# Patient Record
Sex: Female | Born: 1960
Health system: Southern US, Community
[De-identification: ages and names within clinical notes are randomized; demographics above are authoritative.]

## PROBLEM LIST (undated history)

## (undated) DIAGNOSIS — I700 Atherosclerosis of aorta: Secondary | ICD-10-CM

## (undated) DIAGNOSIS — E119 Type 2 diabetes mellitus without complications: Secondary | ICD-10-CM

## (undated) DIAGNOSIS — M549 Dorsalgia, unspecified: Secondary | ICD-10-CM

## (undated) HISTORY — PX: KNEE SURGERY: SHX244

## (undated) HISTORY — DX: Atherosclerosis of aorta: I70.0

## (undated) HISTORY — PX: ROTATOR CUFF REPAIR: SHX139

## (undated) HISTORY — DX: Type 2 diabetes mellitus without complications: E11.9

---

## 2003-02-02 ENCOUNTER — Emergency Department (HOSPITAL_COMMUNITY): Admission: EM | Admit: 2003-02-02 | Discharge: 2003-02-02 | Payer: Self-pay | Admitting: Emergency Medicine

## 2007-04-24 ENCOUNTER — Emergency Department (HOSPITAL_COMMUNITY): Admission: EM | Admit: 2007-04-24 | Discharge: 2007-04-24 | Payer: Self-pay | Admitting: Family Medicine

## 2011-09-13 ENCOUNTER — Emergency Department (HOSPITAL_COMMUNITY)
Admission: EM | Admit: 2011-09-13 | Discharge: 2011-09-14 | Disposition: A | Discharge status: Home / Self Care | Payer: Worker's Compensation | Attending: Emergency Medicine | Admitting: Emergency Medicine

## 2011-09-13 ENCOUNTER — Encounter (HOSPITAL_COMMUNITY): Payer: Self-pay

## 2011-09-13 DIAGNOSIS — Z7982 Long term (current) use of aspirin: Secondary | ICD-10-CM | POA: Insufficient documentation

## 2011-09-13 DIAGNOSIS — Y9229 Other specified public building as the place of occurrence of the external cause: Secondary | ICD-10-CM | POA: Insufficient documentation

## 2011-09-13 DIAGNOSIS — W19XXXA Unspecified fall, initial encounter: Secondary | ICD-10-CM

## 2011-09-13 DIAGNOSIS — F17200 Nicotine dependence, unspecified, uncomplicated: Secondary | ICD-10-CM | POA: Insufficient documentation

## 2011-09-13 DIAGNOSIS — M542 Cervicalgia: Secondary | ICD-10-CM | POA: Insufficient documentation

## 2011-09-13 DIAGNOSIS — R42 Dizziness and giddiness: Secondary | ICD-10-CM | POA: Insufficient documentation

## 2011-09-13 DIAGNOSIS — M549 Dorsalgia, unspecified: Secondary | ICD-10-CM | POA: Insufficient documentation

## 2011-09-13 DIAGNOSIS — W010XXA Fall on same level from slipping, tripping and stumbling without subsequent striking against object, initial encounter: Secondary | ICD-10-CM | POA: Insufficient documentation

## 2011-09-13 DIAGNOSIS — R112 Nausea with vomiting, unspecified: Secondary | ICD-10-CM | POA: Insufficient documentation

## 2011-09-13 NOTE — ED Notes (Signed)
Bed:WHALA<BR> Expected date:<BR> Expected time:<BR> Means of arrival:<BR> Comments:<BR> EMS-Fall

## 2011-09-13 NOTE — ED Notes (Signed)
PER EMS- pt picked up from work [employer] account [employer]) with c/o slipping and fell straight backward, reports hitting neck and back. 2 coworkers helped pt into a wheelchair.  Upon ems, pt started to c/o nausea and dizziness.  No LOC, denies head injury, no vomiting.  Pt alert and oriented.  Placed on KED and c-collar prior to arrival to ED.  Pt remembers the fall.  Pt c/o 9/10 lower back pain.  Pt is able to move all extremities, denies numbness or tingling.

## 2011-09-14 ENCOUNTER — Emergency Department (HOSPITAL_COMMUNITY): Payer: Worker's Compensation

## 2011-09-14 MED ORDER — HYDROCODONE-ACETAMINOPHEN 5-500 MG PO TABS: 1.0000 | ORAL_TABLET | Freq: Four times a day (QID) | ORAL | Status: AC | PRN

## 2011-09-14 MED ORDER — CYCLOBENZAPRINE HCL 10 MG PO TABS: 5.0000 mg | ORAL_TABLET | Freq: Two times a day (BID) | ORAL | Status: AC | PRN

## 2011-09-14 MED ORDER — OXYCODONE-ACETAMINOPHEN 5-325 MG PO TABS
2.0000 | ORAL_TABLET | Freq: Once | ORAL | Status: AC
  Administered 2011-09-14: 2 via ORAL
  Filled 2011-09-14: qty 2

## 2011-09-14 NOTE — ED Notes (Signed)
Xray called to inform that pt could not go to xray with KED in place. Informed Tiffany, PA. KED removed and xray informed.

## 2011-09-14 NOTE — ED Provider Notes (Signed)
History     CSN: [id number]  Arrival date & time 09/13/11  2224   First MD Initiated Contact with Patient 09/14/11 0116      Chief Complaint  Patient presents with  . Fall    (Consider location/radiation/quality/duration/timing/severity/associated sxs/prior treatment) HPI  Patient to the ER after a fall at [facility]. She says that she tripped on a palate falling backwards hitting her head and neck. Two co-workers helped her back up and got her into a wheelchair, since then EMS has put her in a c-collar and sitting spine board. She says that along with neck pain and back pain she is having nausea, vomiting, dizziness. No LOC or vomiting. She denies weakness, numbness or tingling. She appears uncomfortable but is in no acute distress.  History reviewed. No pertinent past medical history.  Past Surgical History  Procedure Date  . Knee surgery     No family history on file.  History  Substance Use Topics  . Smoking status: Current Some Day Smoker    Types: Cigarettes  . Smokeless tobacco: Not on file  . Alcohol Use: Yes    OB History    Grav Para Term Preterm Abortions TAB SAB Ect Mult Living                  Review of Systems   HEENT: denies blurry vision or change in hearing PULMONARY: Denies difficulty breathing and SOB CARDIAC: denies chest pain or heart palpitations MUSCULOSKELETAL:  denies being unable to ambulate ABDOMEN AL: denies abdominal pain GU: denies loss of bowel or urinary control NEURO: denies numbness and tingling in extremities SKIN: no new rashes PSYCH: patient denies anxiety or depression. NECK: Pt denies having neck pain     Allergies  Review of patient's allergies indicates no known allergies.  Home Medications   Current Outpatient Rx  Name Route Sig Dispense Refill  . ASPIRIN EC 81 MG PO TBEC Oral Take 81 mg by mouth every other day.    . CYCLOBENZAPRINE HCL 10 MG PO TABS Oral Take 0.5 tablets (5 mg total) by mouth 2 (two) times  daily as needed for muscle spasms. 20 tablet 0  . HYDROCODONE-ACETAMINOPHEN 5-500 MG PO TABS Oral Take 1-2 tablets by mouth every 6 (six) hours as needed for pain. 15 tablet 0    BP 139/74  Pulse 60  Temp 98.4 F (36.9 C) (Oral)  Resp 16  SpO2 100%  LMP 09/11/2011  Physical Exam  Nursing note and vitals reviewed. Constitutional: She appears well-developed and well-nourished. No distress.  HENT:  Head: Normocephalic. Head is with contusion. Head is without raccoon's eyes, without Battle's sign, without abrasion, without laceration, without right periorbital erythema and without left periorbital erythema. Hair is normal.    Eyes: Pupils are equal, round, and reactive to light.  Neck: Normal range of motion. Neck supple.  Cardiovascular: Normal rate and regular rhythm.   Pulmonary/Chest: Effort normal.  Abdominal: Soft.  Musculoskeletal:       Cervical back: She exhibits decreased range of motion, tenderness, pain and spasm. She exhibits no bony tenderness, no swelling, no edema, no deformity, no laceration and normal pulse.       Back:        Equal strength to bilateral lower extremities. Neurosensory function adequate to both legs. Skin color is normal. Skin is warm and moist. I see no step off deformity, no bony tenderness. Pt is able to ambulate without limp. Pain is relieved when sitting in certain positions.  ROM is decreased due to pain. No crepitus, laceration, effusion, swelling.  Pulses are normal   Neurological: She is alert.  Skin: Skin is warm and dry.    ED Course  Procedures (including critical care time)  Labs Reviewed - No data to display No results found.   1. Fall       MDM  Patients work-up is negative for any abnormalities.  Patient with back pain. No neurological deficits. Patient is ambulatory. No warning symptoms of back pain including: loss of bowel or bladder control, night sweats, waking from sleep with back pain, unexplained fevers or weight  loss, h/o cancer, IVDU, recent trauma. No concern for cauda equina, epidural abscess, or other serious cause of back pain. Conservative measures such as rest, ice/heat and pain medicine indicated with PCP follow-up if no improvement with conservative management.   The patient does not need further testing at this time. I have prescribed Pain medication and Flexeril for the patient. As well as given the patient a referral for Ortho. The patient is stable and this time and has no other concerns of questions.  The patient has been informed to return to the ED if a change or worsening in symptoms occur.            Dorthula Matas, PA 09/18/11 [phone]

## 2011-09-22 NOTE — ED Provider Notes (Signed)
Medical screening examination/treatment/procedure(s) were performed by non-physician practitioner and as supervising physician I was immediately available for consultation/collaboration.  Derwood Kaplan, MD 09/22/11 [phone]

## 2011-12-27 ENCOUNTER — Emergency Department (HOSPITAL_COMMUNITY)
Admission: EM | Admit: 2011-12-27 | Discharge: 2011-12-27 | Disposition: A | Discharge status: Home / Self Care | Payer: Worker's Compensation | Attending: Emergency Medicine | Admitting: Emergency Medicine

## 2011-12-27 ENCOUNTER — Encounter (HOSPITAL_COMMUNITY): Payer: Self-pay | Admitting: *Deleted

## 2011-12-27 DIAGNOSIS — M545 Low back pain, unspecified: Secondary | ICD-10-CM | POA: Insufficient documentation

## 2011-12-27 DIAGNOSIS — F17200 Nicotine dependence, unspecified, uncomplicated: Secondary | ICD-10-CM | POA: Insufficient documentation

## 2011-12-27 MED ORDER — ONDANSETRON 8 MG PO TBDP
8.0000 mg | ORAL_TABLET | Freq: Once | ORAL | Status: AC
  Administered 2011-12-27: 8 mg via ORAL
  Filled 2011-12-27: qty 1

## 2011-12-27 MED ORDER — NAPROXEN 500 MG PO TABS: 500.0000 mg | ORAL_TABLET | Freq: Two times a day (BID) | ORAL | Status: DC

## 2011-12-27 MED ORDER — HYDROMORPHONE HCL PF 1 MG/ML IJ SOLN
1.0000 mg | Freq: Once | INTRAMUSCULAR | Status: AC
  Administered 2011-12-27: 1 mg via INTRAMUSCULAR
  Filled 2011-12-27: qty 1

## 2011-12-27 MED ORDER — HYDROCODONE-ACETAMINOPHEN 5-325 MG PO TABS: ORAL_TABLET | ORAL | Status: DC

## 2011-12-27 MED ORDER — METHOCARBAMOL 500 MG PO TABS: ORAL_TABLET | ORAL | Status: DC

## 2011-12-27 NOTE — ED Notes (Signed)
Fell at work in August - being treated by orthopedic.  States back pain and spasms became worse today.  Denies new injury.

## 2011-12-27 NOTE — ED Provider Notes (Signed)
History     CSN: [id number]  Arrival date & time 12/27/11  1528   First MD Initiated Contact with Patient 12/27/11 1541      Chief Complaint  Patient presents with  . Back Pain    (Consider location/radiation/quality/duration/timing/severity/associated sxs/prior treatment) HPI Comments: Patient with chronic low back pain secondary to a work related injury c/o increasing pain and muscle spasms in her back since last evening.  She states that she is continuing to work and states the job she does causes the pain to become worse at times.  She states she is currently being treated by [facility].  She denies new injury, numbness or weakness of the extremities, saddle anesthesia's, dysuria or incontinence.    Patient is a 51 y.o. female presenting with back pain. The history is provided by the patient.  Back Pain  This is a chronic problem. The current episode started more than 1 week ago. The problem occurs constantly. The problem has been gradually worsening. The pain is associated with lifting heavy objects and twisting. The pain is present in the lumbar spine and sacro-iliac joint. The pain radiates to the left thigh, right thigh, left knee, left foot, right knee and right foot. The pain is moderate. The symptoms are aggravated by bending, twisting and certain positions. The pain is worse during the day. Associated symptoms include leg pain. Pertinent negatives include no chest pain, no fever, no numbness, no abdominal pain, no abdominal swelling, no bowel incontinence, no perianal numbness, no bladder incontinence, no dysuria, no pelvic pain, no paresthesias, no paresis, no tingling and no weakness. Associated symptoms comments: Muscle spasms. She has tried NSAIDs and muscle relaxants for the symptoms. The treatment provided mild relief.    History reviewed. No pertinent past medical history.  Past Surgical History  Procedure Date  . Knee surgery     No family history on  file.  History  Substance Use Topics  . Smoking status: Current Some Day Smoker    Types: Cigarettes  . Smokeless tobacco: Not on file  . Alcohol Use: No    OB History    Grav Para Term Preterm Abortions TAB SAB Ect Mult Living                  Review of Systems  Constitutional: Negative for fever.  Respiratory: Negative for shortness of breath.   Cardiovascular: Negative for chest pain.  Gastrointestinal: Negative for vomiting, abdominal pain, constipation and bowel incontinence.  Genitourinary: Negative for bladder incontinence, dysuria, hematuria, flank pain, decreased urine volume, difficulty urinating and pelvic pain.       No perineal numbness or incontinence of urine or feces  Musculoskeletal: Positive for back pain. Negative for joint swelling.  Skin: Negative for rash.  Neurological: Negative for tingling, weakness, numbness and paresthesias.  All other systems reviewed and are negative.    Allergies  Review of patient's allergies indicates no known allergies.  Home Medications   Current Outpatient Rx  Name  Route  Sig  Dispense  Refill  . DIPHENHYDRAMINE-APAP (SLEEP) 25-500 MG PO TABS   Oral   Take 1 tablet by mouth at bedtime as needed. Sleep           BP 126/56  Pulse 84  Temp 97.9 F (36.6 C) (Oral)  Resp 20  Ht 5\' 8"  (1.727 m)  Wt 195 lb (88.451 kg)  BMI 29.65 kg/m2  SpO2 96%  LMP 09/11/2011  Physical Exam  Nursing note and vitals reviewed.  Constitutional: She is oriented to person, place, and time. She appears well-developed and well-nourished. No distress.  HENT:  Head: Normocephalic and atraumatic.  Neck: Normal range of motion. Neck supple.  Cardiovascular: Normal rate, regular rhythm and intact distal pulses.   No murmur heard. Pulmonary/Chest: Effort normal and breath sounds normal.  Musculoskeletal: She exhibits tenderness. She exhibits no edema.       Lumbar back: She exhibits tenderness, bony tenderness and pain. She exhibits  normal range of motion, no swelling, no deformity, no laceration and normal pulse.       Back:       Diffuse ttp of the lumbar spine and paraspinal muscles.  DP pulses are brisk and equal bilaterally, distal sensation intact,  No distal edema or discoloration  Neurological: She is alert and oriented to person, place, and time. No cranial nerve deficit or sensory deficit. She exhibits normal muscle tone. Coordination and gait normal.  Reflex Scores:      Patellar reflexes are 2+ on the right side and 2+ on the left side.      Achilles reflexes are 2+ on the right side and 2+ on the left side. Skin: Skin is warm and dry.    ED Course  Procedures (including critical care time)  Labs Reviewed - No data to display No results found.      MDM    Patient has diffuse ttp of the lumbar paraspinal muscles.  No focal neuro deficits on exam.  Ambulates with a slow but steady gait.     Patient is currently under orthopedic care at [facility].  Low back pain worse after excessive walking and standing at work last evening.  Pt agrees to follow-up with her orthopedic physician   IM Dilaudid given in the ED  Prescribed: norco #20 Robaxin  naprosyn  Melea Prezioso L. Trisha Mangle, [place] 12/27/11 2011

## 2011-12-29 NOTE — ED Provider Notes (Signed)
Medical screening examination/treatment/procedure(s) were performed by non-physician practitioner and as supervising physician I was immediately available for consultation/collaboration.   Joya Gaskins, MD 12/29/11 [phone]

## 2012-02-19 ENCOUNTER — Encounter (HOSPITAL_COMMUNITY): Payer: Self-pay | Admitting: *Deleted

## 2012-02-19 ENCOUNTER — Emergency Department (HOSPITAL_COMMUNITY)
Admission: EM | Admit: 2012-02-19 | Discharge: 2012-02-19 | Disposition: A | Discharge status: Home / Self Care | Payer: Worker's Compensation | Attending: Emergency Medicine | Admitting: Emergency Medicine

## 2012-02-19 DIAGNOSIS — M5416 Radiculopathy, lumbar region: Secondary | ICD-10-CM

## 2012-02-19 DIAGNOSIS — Z79899 Other long term (current) drug therapy: Secondary | ICD-10-CM | POA: Insufficient documentation

## 2012-02-19 DIAGNOSIS — F17200 Nicotine dependence, unspecified, uncomplicated: Secondary | ICD-10-CM | POA: Insufficient documentation

## 2012-02-19 DIAGNOSIS — IMO0002 Reserved for concepts with insufficient information to code with codable children: Secondary | ICD-10-CM | POA: Insufficient documentation

## 2012-02-19 HISTORY — DX: Dorsalgia, unspecified: M54.9

## 2012-02-19 LAB — BASIC METABOLIC PANEL
CO2: 26 mEq/L (ref 19–32)
Calcium: 9.7 mg/dL (ref 8.4–10.5)
Chloride: 99 mEq/L (ref 96–112)
Creatinine, Ser: 0.78 mg/dL (ref 0.50–1.10)
GFR calc Af Amer: >90 mL/min (ref >90)
GFR calc non Af Amer: >90 mL/min (ref >90)
Potassium: 4 mEq/L (ref 3.5–5.1)
Sodium: 136 mEq/L (ref 135–145)

## 2012-02-19 MED ORDER — HYDROMORPHONE HCL PF 1 MG/ML IJ SOLN
1.0000 mg | Freq: Once | INTRAMUSCULAR | Status: AC
  Administered 2012-02-19: 1 mg via INTRAMUSCULAR
  Filled 2012-02-19: qty 1

## 2012-02-19 MED ORDER — ONDANSETRON 8 MG PO TBDP
ORAL_TABLET | ORAL | Status: AC
  Administered 2012-02-19: 8 mg
  Filled 2012-02-19: qty 1

## 2012-02-19 MED ORDER — OXYCODONE-ACETAMINOPHEN 5-325 MG PO TABS: 1.0000 | ORAL_TABLET | ORAL | Status: AC | PRN

## 2012-02-19 MED ORDER — METHOCARBAMOL 500 MG PO TABS: 1000.0000 mg | ORAL_TABLET | Freq: Once | ORAL | Status: DC

## 2012-02-19 MED ORDER — DIAZEPAM 5 MG PO TABS: 5.0000 mg | ORAL_TABLET | Freq: Three times a day (TID) | ORAL | Status: DC | PRN

## 2012-02-19 NOTE — ED Notes (Signed)
Back spasms, worsening today with hx of same.  Denies new injury.

## 2012-02-19 NOTE — ED Notes (Signed)
Pain and "cramping" in back , legs and feet. Fell in Aug. 2013, and has had problem since then.  Plans to have injections in back.  No new injury.

## 2012-02-20 NOTE — ED Provider Notes (Signed)
Medical screening examination/treatment/procedure(s) were performed by non-physician practitioner and as supervising physician I was immediately available for consultation/collaboration. Mercy Leppla, MD, FACEP   Kayton Dunaj L Carmaleta Youngers, MD 02/20/12 1100 

## 2012-02-20 NOTE — ED Provider Notes (Signed)
History     CSN: [id number]  Arrival date & time 02/19/12  1120   First MD Initiated Contact with Patient 02/19/12 1139      Chief Complaint  Patient presents with  . Back Pain    (Consider location/radiation/quality/duration/timing/severity/associated sxs/prior treatment) HPI Comments: Catherine Chapman is a 52 y.o. Female presenting with acute on chronic low back pain which has which has been present for the past day.   Patient denies any new injury specifically.  There is radiation into her bilaterl lower extremities.  She is currently under the care of Dr. Ethelene Hal with [facility] and is anticipating steroid injection therapy.  There has been no weakness or numbness in the lower extremities and no urinary or bowel retention or incontinence.  Patient does not have a history of cancer or IVDU.  She does report increased intermittent muscle spasms in various muscles in her lower legs and reports a history of prior low potassium.      The history is provided by the patient.    Past Medical History  Diagnosis Date  . Back pain     Past Surgical History  Procedure Date  . Knee surgery     No family history on file.  History  Substance Use Topics  . Smoking status: Current Some Day Smoker    Types: Cigarettes  . Smokeless tobacco: Not on file  . Alcohol Use: No    OB History    Grav Para Term Preterm Abortions TAB SAB Ect Mult Living                  Review of Systems  Constitutional: Negative for fever.  Respiratory: Negative for shortness of breath.   Cardiovascular: Negative for chest pain and leg swelling.  Gastrointestinal: Negative for abdominal pain, constipation and abdominal distention.  Genitourinary: Negative for dysuria, urgency, frequency, flank pain and difficulty urinating.  Musculoskeletal: Positive for back pain. Negative for joint swelling and gait problem.  Skin: Negative for rash.  Neurological: Negative for weakness and numbness.     Allergies  Tramadol  Home Medications   Current Outpatient Rx  Name  Route  Sig  Dispense  Refill  . IBUPROFEN 200 MG PO TABS   Oral   Take 400 mg by mouth every 6 (six) hours as needed. Pain.         Marland Kitchen NAPROXEN 500 MG PO TABS   Oral   Take 1 tablet (500 mg total) by mouth 2 (two) times daily with a meal.   20 tablet   0   . DIAZEPAM 5 MG PO TABS   Oral   Take 1 tablet (5 mg total) by mouth every 8 (eight) hours as needed (muscle spasm).   15 tablet   0   . OXYCODONE-ACETAMINOPHEN 5-325 MG PO TABS   Oral   Take 1 tablet by mouth every 4 (four) hours as needed for pain.   20 tablet   0     BP 149/68  Pulse 105  Temp 98.2 F (36.8 C) (Oral)  Resp 20  Ht 5\' 8"  (1.727 m)  Wt 195 lb (88.451 kg)  BMI 29.65 kg/m2  SpO2 99%  LMP 09/11/2011  Physical Exam  Nursing note and vitals reviewed. Constitutional: She appears well-developed and well-nourished.  HENT:  Head: Normocephalic.  Eyes: Conjunctivae normal are normal.  Neck: Normal range of motion. Neck supple.  Cardiovascular: Normal rate and intact distal pulses.  Pedal pulses normal.  Pulmonary/Chest: Effort normal.  Abdominal: Soft. Bowel sounds are normal. She exhibits no distension and no mass.  Musculoskeletal: Normal range of motion. She exhibits no edema.       Lumbar back: She exhibits tenderness. She exhibits no bony tenderness, no swelling, no edema and no spasm.  Neurological: She is alert. She has normal strength. She displays no atrophy and no tremor. No sensory deficit. Gait normal.  Reflex Scores:      Patellar reflexes are 2+ on the right side and 2+ on the left side.      Achilles reflexes are 2+ on the right side and 2+ on the left side.      No strength deficit noted in hip and knee flexor and extensor muscle groups.  Ankle flexion and extension intact.  Skin: Skin is warm and dry.  Psychiatric: She has a normal mood and affect.    ED Course  Procedures (including critical  care time)  Labs Reviewed  BASIC METABOLIC PANEL - Abnormal; Notable for the following:    Glucose, Bld 113 (*)     All other components within normal limits   No results found.   1. Lumbar radiculopathy       MDM  Patients labs and/or radiological studies were reviewed during the medical decision making and disposition process.  patient prescribed oxycodone for use in place of her current hydrocodone and also prescribed Valium and instructed to use in place of her Robaxin.  Also started to followup with Dr. Ethelene Hal for further management of her chronic symptoms.  No neuro deficit on exam or by history to suggest emergent or surgical presentation.  Also discussed worsened sx that should prompt immediate re-evaluation including distal weakness, bowel/bladder retention/incontinence.              Burgess Amor, [place] 02/20/12 [phone]

## 2012-03-01 ENCOUNTER — Encounter (HOSPITAL_COMMUNITY): Payer: Self-pay | Admitting: Emergency Medicine

## 2012-03-01 ENCOUNTER — Emergency Department (HOSPITAL_COMMUNITY)
Admission: EM | Admit: 2012-03-01 | Discharge: 2012-03-01 | Disposition: A | Discharge status: Home / Self Care | Payer: Worker's Compensation | Attending: Emergency Medicine | Admitting: Emergency Medicine

## 2012-03-01 DIAGNOSIS — G8929 Other chronic pain: Secondary | ICD-10-CM | POA: Insufficient documentation

## 2012-03-01 DIAGNOSIS — Z87828 Personal history of other (healed) physical injury and trauma: Secondary | ICD-10-CM | POA: Insufficient documentation

## 2012-03-01 DIAGNOSIS — M545 Low back pain, unspecified: Secondary | ICD-10-CM | POA: Insufficient documentation

## 2012-03-01 DIAGNOSIS — Z87891 Personal history of nicotine dependence: Secondary | ICD-10-CM | POA: Insufficient documentation

## 2012-03-01 MED ORDER — DIAZEPAM 5 MG PO TABS: ORAL_TABLET | ORAL | Status: DC

## 2012-03-01 MED ORDER — HYDROMORPHONE HCL PF 1 MG/ML IJ SOLN
1.0000 mg | Freq: Once | INTRAMUSCULAR | Status: AC
  Administered 2012-03-01: 1 mg via INTRAMUSCULAR
  Filled 2012-03-01: qty 1

## 2012-03-01 MED ORDER — OXYCODONE-ACETAMINOPHEN 5-325 MG PO TABS: 1.0000 | ORAL_TABLET | Freq: Four times a day (QID) | ORAL | Status: AC | PRN

## 2012-03-01 MED ORDER — ONDANSETRON HCL 4 MG PO TABS
4.0000 mg | ORAL_TABLET | Freq: Once | ORAL | Status: AC
  Administered 2012-03-01: 4 mg via ORAL
  Filled 2012-03-01: qty 1

## 2012-03-01 MED ORDER — KETOROLAC TROMETHAMINE 10 MG PO TABS
10.0000 mg | ORAL_TABLET | Freq: Once | ORAL | Status: AC
  Administered 2012-03-01: 10 mg via ORAL
  Filled 2012-03-01: qty 1

## 2012-03-01 NOTE — ED Provider Notes (Signed)
History     CSN: [id number]  Arrival date & time 03/01/12  0630   First MD Initiated Contact with Patient 03/01/12 0935      Chief Complaint  Patient presents with  . Back Pain    (Consider location/radiation/quality/duration/timing/severity/associated sxs/prior treatment) Patient is a 52 y.o. female presenting with back pain. The history is provided by the patient.  Back Pain  This is a chronic problem. The current episode started more than 1 week ago. The problem occurs daily. The problem has been gradually worsening. Associated with: fall in 08/2011. Seeing workman's comp. The pain is present in the lumbar spine. The quality of the pain is described as shooting, aching and cramping. The pain radiates to the left thigh and right thigh. The pain is severe. The symptoms are aggravated by certain positions. The pain is the same all the time. Pertinent negatives include no chest pain, no abdominal pain and no dysuria. Associated symptoms comments: No falls. No loss of bowel or bladder function. Pain with standing.. She has tried NSAIDs for the symptoms. The treatment provided no relief.    Past Medical History  Diagnosis Date  . Back pain     Past Surgical History  Procedure Date  . Knee surgery     No family history on file.  History  Substance Use Topics  . Smoking status: Former Smoker    Types: Cigarettes  . Smokeless tobacco: Not on file  . Alcohol Use: No    OB History    Grav Para Term Preterm Abortions TAB SAB Ect Mult Living                  Review of Systems  Constitutional: Negative for activity change.       All ROS Neg except as noted in HPI  HENT: Negative for nosebleeds and neck pain.   Eyes: Negative for photophobia and discharge.  Respiratory: Negative for cough, shortness of breath and wheezing.   Cardiovascular: Negative for chest pain and palpitations.  Gastrointestinal: Negative for abdominal pain and blood in stool.  Genitourinary: Negative for  dysuria, frequency and hematuria.  Musculoskeletal: Positive for back pain. Negative for arthralgias.  Skin: Negative.   Neurological: Negative for dizziness, seizures and speech difficulty.  Psychiatric/Behavioral: Negative for hallucinations and confusion.    Allergies  Tramadol  Home Medications   Current Outpatient Rx  Name  Route  Sig  Dispense  Refill  . DIAZEPAM 5 MG PO TABS   Oral   Take 1 tablet (5 mg total) by mouth every 8 (eight) hours as needed (muscle spasm).   15 tablet   0   . IBUPROFEN 200 MG PO TABS   Oral   Take 400 mg by mouth every 6 (six) hours as needed. Pain.         Marland Kitchen NAPROXEN 500 MG PO TABS   Oral   Take 1 tablet (500 mg total) by mouth 2 (two) times daily with a meal.   20 tablet   0     BP 125/78  Pulse 96  Temp 98.1 F (36.7 C)  Resp 18  Ht 5\' 8"  (1.727 m)  Wt 195 lb (88.451 kg)  BMI 29.65 kg/m2  SpO2 98%  LMP 02/06/2012  Physical Exam  Nursing note and vitals reviewed. Constitutional: She is oriented to person, place, and time. She appears well-developed and well-nourished.  Non-toxic appearance.         Pt appears uncomfortable.  HENT:  Head:  Normocephalic.  Right Ear: Tympanic membrane and external ear normal.  Left Ear: Tympanic membrane and external ear normal.  Eyes: EOM and lids are normal. Pupils are equal, round, and reactive to light.  Neck: Normal range of motion. Neck supple. Carotid bruit is not present.  Cardiovascular: Normal rate, regular rhythm, normal heart sounds, intact distal pulses and normal pulses.   Pulmonary/Chest: Breath sounds normal. No respiratory distress.  Abdominal: Soft. Bowel sounds are normal. There is no tenderness. There is no guarding.  Musculoskeletal: Normal range of motion.       Severe pain with attempted leg raises.  Lymphadenopathy:       Head (right side): No submandibular adenopathy present.       Head (left side): No submandibular adenopathy present.    She has no cervical  adenopathy.  Neurological: She is alert and oriented to person, place, and time. She has normal strength. No cranial nerve deficit or sensory deficit.       Gait is steady, but slow. No foot drop  Skin: Skin is warm and dry.  Psychiatric: She has a normal mood and affect. Her speech is normal.    ED Course  Procedures (including critical care time)  Labs Reviewed - No data to display No results found.   No diagnosis found.    MDM  I have reviewed nursing notes, vital signs, and all appropriate lab and imaging results for this patient. No gross neuro deficit noted on today's exam. Severe pain with change of position and with attempted leg raises. Pt states she has been in contact with her orthopedic MD at [facility] and is scheduled to have injections of the back on Feb 12. Rx for valium and percocet have been ordered. Discussed the need for caution with recurrent use of narcotic medications.        Kathie Dike, [place] 03/01/12 1037

## 2012-03-01 NOTE — ED Notes (Signed)
Pt here for mid/lower back pain. Pt has h/s chronic back pain  From fall at work 09/05/11.

## 2012-03-01 NOTE — ED Provider Notes (Signed)
Medical screening examination/treatment/procedure(s) were performed by non-physician practitioner and as supervising physician I was immediately available for consultation/collaboration.   Dione Booze, MD 03/01/12 [phone]

## 2012-03-20 ENCOUNTER — Institutional Professional Consult (permissible substitution): Payer: Self-pay | Admitting: Medical

## 2012-03-27 ENCOUNTER — Emergency Department (HOSPITAL_COMMUNITY)
Admission: EM | Admit: 2012-03-27 | Discharge: 2012-03-28 | Disposition: A | Discharge status: Home / Self Care | Payer: Worker's Compensation | Attending: Emergency Medicine | Admitting: Emergency Medicine

## 2012-03-27 ENCOUNTER — Encounter (HOSPITAL_COMMUNITY): Payer: Self-pay | Admitting: Emergency Medicine

## 2012-03-27 DIAGNOSIS — Z8739 Personal history of other diseases of the musculoskeletal system and connective tissue: Secondary | ICD-10-CM | POA: Insufficient documentation

## 2012-03-27 DIAGNOSIS — M549 Dorsalgia, unspecified: Secondary | ICD-10-CM

## 2012-03-27 DIAGNOSIS — Z87891 Personal history of nicotine dependence: Secondary | ICD-10-CM | POA: Insufficient documentation

## 2012-03-27 DIAGNOSIS — G8929 Other chronic pain: Secondary | ICD-10-CM | POA: Insufficient documentation

## 2012-03-27 MED ORDER — ONDANSETRON HCL 4 MG PO TABS
4.0000 mg | ORAL_TABLET | Freq: Once | ORAL | Status: AC
  Administered 2012-03-27: 4 mg via ORAL
  Filled 2012-03-27: qty 1

## 2012-03-27 MED ORDER — MORPHINE SULFATE 4 MG/ML IJ SOLN
8.0000 mg | Freq: Once | INTRAMUSCULAR | Status: AC
  Administered 2012-03-27: 8 mg via INTRAMUSCULAR
  Filled 2012-03-27: qty 2

## 2012-03-27 MED ORDER — METHOCARBAMOL 500 MG PO TABS
1000.0000 mg | ORAL_TABLET | Freq: Once | ORAL | Status: AC
  Administered 2012-03-27: 1000 mg via ORAL
  Filled 2012-03-27: qty 2

## 2012-03-27 MED ORDER — DEXAMETHASONE SODIUM PHOSPHATE 4 MG/ML IJ SOLN
8.0000 mg | Freq: Once | INTRAMUSCULAR | Status: AC
  Administered 2012-03-27: 8 mg via INTRAMUSCULAR
  Filled 2012-03-27: qty 2

## 2012-03-27 NOTE — ED Provider Notes (Signed)
History     CSN: [id number]  Arrival date & time 03/27/12  2303   First MD Initiated Contact with Patient 03/27/12 2316      Chief Complaint  Patient presents with  . Back Pain    (Consider location/radiation/quality/duration/timing/severity/associated sxs/prior treatment) Patient is a 52 y.o. female presenting with back pain. The history is provided by the patient.  Back Pain Location:  Lumbar spine Quality:  Aching and shooting Pain severity:  Severe Pain is:  Same all the time Onset quality:  Gradual Timing:  Constant Progression:  Worsening Chronicity:  Chronic Context: occupational injury   Relieved by:  Nothing Worsened by:  Standing and movement Ineffective treatments:  Lying down Associated symptoms: no abdominal pain, no bladder incontinence, no bowel incontinence, no chest pain, no dysuria and no perianal numbness   Risk factors comment:  Hx of degenerative disease of the  L spine.   Past Medical History  Diagnosis Date  . Back pain     Past Surgical History  Procedure Laterality Date  . Knee surgery      History reviewed. No pertinent family history.  History  Substance Use Topics  . Smoking status: Former Smoker    Types: Cigarettes  . Smokeless tobacco: Not on file  . Alcohol Use: No    OB History   Grav Para Term Preterm Abortions TAB SAB Ect Mult Living                  Review of Systems  Constitutional: Negative for activity change.       All ROS Neg except as noted in HPI  HENT: Negative for nosebleeds and neck pain.   Eyes: Negative for photophobia and discharge.  Respiratory: Negative for cough, shortness of breath and wheezing.   Cardiovascular: Negative for chest pain and palpitations.  Gastrointestinal: Negative for abdominal pain, blood in stool and bowel incontinence.  Genitourinary: Negative for bladder incontinence, dysuria, frequency and hematuria.  Musculoskeletal: Positive for back pain. Negative for arthralgias.  Skin:  Negative.   Neurological: Negative for dizziness, seizures and speech difficulty.  Psychiatric/Behavioral: Negative for hallucinations and confusion.    Allergies  Tramadol  Home Medications   Current Outpatient Rx  Name  Route  Sig  Dispense  Refill  . diazepam (VALIUM) 5 MG tablet   Oral   Take 1 tablet (5 mg total) by mouth every 8 (eight) hours as needed (muscle spasm).   15 tablet   0   . diazepam (VALIUM) 5 MG tablet      1 po tid as needed for spasm   15 tablet   0   . ibuprofen (ADVIL,MOTRIN) 200 MG tablet   Oral   Take 600 mg by mouth every 6 (six) hours as needed. Pain         . oxyCODONE-acetaminophen (PERCOCET/ROXICET) 5-325 MG per tablet   Oral   Take 1 tablet by mouth every 4 (four) hours as needed. Pain           BP 134/68  Pulse 90  Temp(Src) 98.2 F (36.8 C) (Oral)  Resp 17  Ht 5\' 8"  (1.727 m)  Wt 200 lb (90.719 kg)  BMI 30.42 kg/m2  SpO2 96%  LMP 01/06/2012  Physical Exam  Nursing note and vitals reviewed. Constitutional: She is oriented to person, place, and time. She appears well-developed and well-nourished.  Non-toxic appearance.  HENT:  Head: Normocephalic.  Right Ear: Tympanic membrane and external ear normal.  Left Ear: Tympanic  membrane and external ear normal.  Eyes: EOM and lids are normal. Pupils are equal, round, and reactive to light.  Neck: Normal range of motion. Neck supple. Carotid bruit is not present.  Cardiovascular: Normal rate, regular rhythm, normal heart sounds, intact distal pulses and normal pulses.   Pulmonary/Chest: Breath sounds normal. No respiratory distress.  Abdominal: Soft. Bowel sounds are normal. There is no tenderness. There is no guarding.  Musculoskeletal: Normal range of motion.  Pain to palpation of the lumbar paraspinal area. No palpable step off. No hot areas.  Lymphadenopathy:       Head (right side): No submandibular adenopathy present.       Head (left side): No submandibular adenopathy  present.    She has no cervical adenopathy.  Neurological: She is alert and oriented to person, place, and time. She has normal strength. No cranial nerve deficit or sensory deficit.  No motor or sensory deficits. Gait steady.  Skin: Skin is warm and dry.  Psychiatric: She has a normal mood and affect. Her speech is normal.    ED Course  Procedures (including critical care time)  Labs Reviewed - No data to display No results found.   No diagnosis found.    MDM  I have reviewed nursing notes, vital signs, and all appropriate lab and imaging results for this patient. Pt states she recently received an injection in her back by [facility], but this did not help. A Rx for a muscle relaxer and pain med was to be faxed to her pharmacy, but only the muscle relaxant was found by the pharmacy. She request a shot of pain medication until she can resolve the issue tomorrow. Oral robaxin and IM morphine given to  the patient. She is to see her orthopedic MD tomorrow.       Kathie Dike, PA-C 03/27/12 2343

## 2012-03-27 NOTE — ED Notes (Signed)
Pt has chronic back pain and is out of pain medication.

## 2012-03-28 NOTE — ED Provider Notes (Signed)
Medical screening examination/treatment/procedure(s) were performed by non-physician practitioner and as supervising physician I was immediately available for consultation/collaboration.  Nicoletta Dress. Colon Branch, MD 03/28/12 [phone]

## 2012-04-15 ENCOUNTER — Emergency Department (HOSPITAL_COMMUNITY)
Admission: EM | Admit: 2012-04-15 | Discharge: 2012-04-15 | Disposition: A | Discharge status: Home / Self Care | Payer: BC Managed Care – PPO | Attending: Emergency Medicine | Admitting: Emergency Medicine

## 2012-04-15 DIAGNOSIS — M546 Pain in thoracic spine: Secondary | ICD-10-CM | POA: Insufficient documentation

## 2012-04-15 DIAGNOSIS — M545 Low back pain, unspecified: Secondary | ICD-10-CM | POA: Insufficient documentation

## 2012-04-15 DIAGNOSIS — R9389 Abnormal findings on diagnostic imaging of other specified body structures: Secondary | ICD-10-CM

## 2012-04-15 DIAGNOSIS — G8929 Other chronic pain: Secondary | ICD-10-CM

## 2012-04-15 DIAGNOSIS — Z87891 Personal history of nicotine dependence: Secondary | ICD-10-CM | POA: Insufficient documentation

## 2012-04-15 DIAGNOSIS — R937 Abnormal findings on diagnostic imaging of other parts of musculoskeletal system: Secondary | ICD-10-CM | POA: Insufficient documentation

## 2012-04-15 DIAGNOSIS — Z79899 Other long term (current) drug therapy: Secondary | ICD-10-CM | POA: Insufficient documentation

## 2012-04-15 MED ORDER — OXYCODONE-ACETAMINOPHEN 5-325 MG PO TABS: 2.0000 | ORAL_TABLET | ORAL | Status: DC | PRN

## 2012-04-15 NOTE — ED Provider Notes (Signed)
History  This chart was scribed for Hurman Horn, MD by Ardeen Jourdain, ED Scribe. This patient was seen in room TR06C/TR06C and the patient's care was started at 1854.   CSN: [id number]  Arrival date & time 04/15/12  1409   None     Chief Complaint  Patient presents with  . Follow-up     The history is provided by the patient. No language interpreter was used.    Catherine Chapman is a 52 y.o. female with a h/o chronic back pain who presents to the Emergency Department complaining of gradually worsening, gradual onset, constant lower back pain that began several months ago. She states she the pain radiates into her bilateral legs and more in the left. She states she was told she had a spot on her MRI in October that resembled sarcoidosis. She reports she needs a routine CAT scan of her chest. Pt denies fever, neck pain, sore throat, visual disturbance, CP, cough, SOB, abdominal pain, nausea, emesis, diarrhea, urinary symptoms, HA, weakness, numbness and rash as associated symptoms.      Past Medical History  Diagnosis Date  . Back pain     Past Surgical History  Procedure Laterality Date  . Knee surgery      No family history on file.  History  Substance Use Topics  . Smoking status: Former Smoker    Types: Cigarettes  . Smokeless tobacco: Not on file  . Alcohol Use: No   No OB history available.   Review of Systems  10 Systems reviewed and all are negative for acute change except as noted in the HPI.   Allergies  Tramadol  Home Medications   Current Outpatient Rx  Name  Route  Sig  Dispense  Refill  . HYDROcodone-acetaminophen (NORCO) 5-325 MG per tablet   Oral   Take 1 tablet by mouth every 6 (six) hours as needed for pain.         . methocarbamol (ROBAXIN) 500 MG tablet   Oral   Take 500 mg by mouth 4 (four) times daily.         . naproxen (NAPROSYN) 500 MG tablet   Oral   Take 500 mg by mouth 2 (two) times daily with a meal.         .  oxyCODONE-acetaminophen (PERCOCET) 5-325 MG per tablet   Oral   Take 2 tablets by mouth every 4 (four) hours as needed for pain.   6 tablet   0     Triage Vitals: BP 119/94  Pulse 81  Temp(Src) 98.6 F (37 C)  Resp 16  SpO2 99%  LMP 01/06/2012  Physical Exam  Nursing note and vitals reviewed. Constitutional:  Awake, alert, nontoxic appearance with baseline speech.  HENT:  Head: Atraumatic.  Eyes: Pupils are equal, round, and reactive to light. Right eye exhibits no discharge. Left eye exhibits no discharge.  Neck: Neck supple.  Cardiovascular: Normal rate and regular rhythm.   No murmur heard. Pulmonary/Chest: Effort normal and breath sounds normal. No respiratory distress. She has no wheezes. She has no rales. She exhibits no tenderness.  Abdominal: Soft. Bowel sounds are normal. She exhibits no mass. There is no tenderness. There is no rebound.  Musculoskeletal: She exhibits tenderness. She exhibits no edema.       Thoracic back: She exhibits no tenderness.       Lumbar back: She exhibits no tenderness.  Bilateral lower extremities non tender without new rashes or color change,  baseline ROM with intact DP pulses, CR<2 secs all digits bilaterally, sensation baseline light touch bilaterally for pt, DTR's symmetric and intact bilaterally KJ / AJ, motor symmetric bilateral 5 / 5 hip flexion, quadriceps, hamstrings, EHL, foot dorsiflexion, foot plantarflexion, gait somewhat antalgic but without apparent new ataxia. Diffuse thoracic and lumbar tenderness   Neurological:  Mental status baseline for patient.  Upper extremity motor strength and sensation intact and symmetric bilaterally.  Skin: No rash noted.  Psychiatric: She has a normal mood and affect.    ED Course  Procedures (including critical care time)  DIAGNOSTIC STUDIES: Oxygen Saturation is 99% on room air, normal by my interpretation.    COORDINATION OF CARE:  7:02 PM: Patient / Family / Caregiver informed of  clinical course, understand medical decision-making process, and agree with plan.    Labs Reviewed - No data to display No results found.   1. Chronic back pain greater than 3 months duration   2. Abnormal MRI       MDM  I personally performed the services described in this documentation, which was scribed in my presence. The recorded information has been reviewed and is accurate. I doubt any other EMC precluding discharge at this time including, but not necessarily limited to the following:SBI, cauda equina.  Hurman Horn, MD 04/19/12 2202
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
## 2012-12-24 ENCOUNTER — Other Ambulatory Visit (HOSPITAL_COMMUNITY): Payer: Self-pay | Admitting: Nurse Practitioner

## 2012-12-24 DIAGNOSIS — Z139 Encounter for screening, unspecified: Secondary | ICD-10-CM

## 2012-12-31 ENCOUNTER — Ambulatory Visit (HOSPITAL_COMMUNITY)
Admission: RE | Admit: 2012-12-31 | Discharge: 2012-12-31 | Disposition: A | Discharge status: Home / Self Care | Payer: Self-pay | Source: Ambulatory Visit | Attending: Nurse Practitioner | Admitting: Nurse Practitioner

## 2012-12-31 DIAGNOSIS — Z139 Encounter for screening, unspecified: Secondary | ICD-10-CM

## 2013-06-13 ENCOUNTER — Ambulatory Visit: Payer: Self-pay | Admitting: Family Medicine

## 2013-06-13 VITALS — BP 136/72 | HR 83 | Temp 98.1°F | Resp 16 | Ht 67.0 in | Wt 216.4 lb

## 2013-06-13 DIAGNOSIS — Z0289 Encounter for other administrative examinations: Secondary | ICD-10-CM

## 2013-06-13 DIAGNOSIS — Z0000 Encounter for general adult medical examination without abnormal findings: Secondary | ICD-10-CM

## 2013-06-13 NOTE — Progress Notes (Signed)
°  This chart was scribed for Ethelda Chick, MD by Joaquin Music, ED Scribe. This patient was seen in room Room/bed 14 and the patient's care was started at 10:33 AM. Subjective:    Patient ID: Catherine Chapman, female    DOB: Jun 15, 1960, 54 y.o.   MRN: [id number] Chief Complaint  Patient presents with   Annual Exam    DOT   HPI Catherine Chapman is a 53 y.o. female who presents to the [facility] annual exam. 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 Reports having her rotator cuft procedure in 1999 and states she had a procedure to her L knee many years ago. Denies hx of HTN.  Review of Systems  All other systems reviewed and are negative.  Objective:   Physical Exam  Nursing note and vitals reviewed. Constitutional: She is oriented to person, place, and time. She appears well-developed and well-nourished. No distress.  HENT:  Head: Normocephalic and atraumatic.  Right Ear: External ear normal.  Left Ear: External ear normal.  Nose: Nose normal.  Mouth/Throat: Oropharynx is clear and moist.  Eyes: Conjunctivae and EOM are normal. Pupils are equal, round, and reactive to light.  Neck: Normal range of motion. Neck supple. No tracheal deviation present.  Cardiovascular: Normal rate, regular rhythm, normal heart sounds and intact distal pulses.   Pulmonary/Chest: Effort normal and breath sounds normal. No respiratory distress.  Abdominal: Soft. Bowel sounds are normal. She exhibits no distension. There is no tenderness.  Musculoskeletal: Normal range of motion.  Neurological: She is alert and oriented to person, place, and time. She has normal reflexes.  Skin: Skin is warm and dry. No rash noted.  Psychiatric: She has a normal mood and affect. Her behavior is normal. Judgment and thought content normal.   Triage Vitals:Pulse 83   Temp(Src) 98.1 F (36.7 C) (Oral)   Resp 16   Ht 5\' 7"  (1.702 m)   Wt 216 lb 6.4 oz (98.158 kg)   BMI 33.88 kg/m2    SpO2 99%   LMP 06/11/2013  BP during Physical Exam:120/72 Assessment & Plan:    Forms completed Elvina Sidle

## 2013-12-25 ENCOUNTER — Emergency Department (HOSPITAL_COMMUNITY)
Admission: EM | Admit: 2013-12-25 | Discharge: 2013-12-25 | Disposition: A | Discharge status: Home / Self Care | Payer: BC Managed Care – PPO | Attending: Emergency Medicine | Admitting: Emergency Medicine

## 2013-12-25 ENCOUNTER — Encounter (HOSPITAL_COMMUNITY): Payer: Self-pay | Admitting: Emergency Medicine

## 2013-12-25 DIAGNOSIS — Z8739 Personal history of other diseases of the musculoskeletal system and connective tissue: Secondary | ICD-10-CM | POA: Insufficient documentation

## 2013-12-25 DIAGNOSIS — Y998 Other external cause status: Secondary | ICD-10-CM | POA: Insufficient documentation

## 2013-12-25 DIAGNOSIS — Y939 Activity, unspecified: Secondary | ICD-10-CM | POA: Insufficient documentation

## 2013-12-25 DIAGNOSIS — S4990XA Unspecified injury of shoulder and upper arm, unspecified arm, initial encounter: Secondary | ICD-10-CM | POA: Insufficient documentation

## 2013-12-25 DIAGNOSIS — Z87891 Personal history of nicotine dependence: Secondary | ICD-10-CM | POA: Insufficient documentation

## 2013-12-25 DIAGNOSIS — S3992XA Unspecified injury of lower back, initial encounter: Secondary | ICD-10-CM | POA: Insufficient documentation

## 2013-12-25 DIAGNOSIS — Y929 Unspecified place or not applicable: Secondary | ICD-10-CM | POA: Insufficient documentation

## 2013-12-25 MED ORDER — METHOCARBAMOL 500 MG PO TABS: 500.0000 mg | ORAL_TABLET | Freq: Two times a day (BID) | ORAL | Status: DC

## 2013-12-25 MED ORDER — HYDROCODONE-ACETAMINOPHEN 5-325 MG PO TABS: 2.0000 | ORAL_TABLET | ORAL | Status: DC | PRN

## 2013-12-25 NOTE — Discharge Instructions (Signed)
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
## 2015-12-20 ENCOUNTER — Other Ambulatory Visit (HOSPITAL_COMMUNITY): Payer: Self-pay | Admitting: Nurse Practitioner

## 2015-12-20 DIAGNOSIS — Z1231 Encounter for screening mammogram for malignant neoplasm of breast: Secondary | ICD-10-CM

## 2015-12-30 ENCOUNTER — Encounter (HOSPITAL_COMMUNITY): Payer: Self-pay

## 2017-01-17 ENCOUNTER — Observation Stay (HOSPITAL_COMMUNITY): Payer: Self-pay | Admitting: Certified Registered Nurse Anesthetist

## 2017-01-17 ENCOUNTER — Other Ambulatory Visit: Payer: Self-pay

## 2017-01-17 ENCOUNTER — Encounter (HOSPITAL_COMMUNITY)
Admission: EM | Disposition: A | Discharge status: Home / Self Care | Payer: Self-pay | Source: Home / Self Care | Attending: Emergency Medicine

## 2017-01-17 ENCOUNTER — Emergency Department (HOSPITAL_COMMUNITY): Payer: Self-pay

## 2017-01-17 ENCOUNTER — Observation Stay (HOSPITAL_COMMUNITY)
Admission: EM | Admit: 2017-01-17 | Discharge: 2017-01-17 | Disposition: A | Discharge status: Home / Self Care | Payer: Self-pay | Attending: Orthopedic Surgery | Admitting: Orthopedic Surgery

## 2017-01-17 ENCOUNTER — Encounter (HOSPITAL_COMMUNITY): Payer: Self-pay

## 2017-01-17 DIAGNOSIS — W01110A Fall on same level from slipping, tripping and stumbling with subsequent striking against sharp glass, initial encounter: Secondary | ICD-10-CM | POA: Insufficient documentation

## 2017-01-17 DIAGNOSIS — Z87891 Personal history of nicotine dependence: Secondary | ICD-10-CM | POA: Insufficient documentation

## 2017-01-17 DIAGNOSIS — Z888 Allergy status to other drugs, medicaments and biological substances status: Secondary | ICD-10-CM | POA: Insufficient documentation

## 2017-01-17 DIAGNOSIS — S61412A Laceration without foreign body of left hand, initial encounter: Secondary | ICD-10-CM | POA: Diagnosis present

## 2017-01-17 DIAGNOSIS — S62611B Displaced fracture of proximal phalanx of left index finger, initial encounter for open fracture: Principal | ICD-10-CM | POA: Insufficient documentation

## 2017-01-17 DIAGNOSIS — S63691A Other sprain of left index finger, initial encounter: Secondary | ICD-10-CM | POA: Insufficient documentation

## 2017-01-17 DIAGNOSIS — S61422A Laceration with foreign body of left hand, initial encounter: Secondary | ICD-10-CM

## 2017-01-17 HISTORY — PX: I&D EXTREMITY: SHX5045

## 2017-01-17 HISTORY — PX: I & D EXTREMITY: SHX5045

## 2017-01-17 LAB — CBC WITH DIFFERENTIAL/PLATELET
Basophils Absolute: 0 10*3/uL (ref 0.0–0.1)
Basophils Relative: 0 %
EOS ABS: 0.1 10*3/uL (ref 0.0–0.7)
Eosinophils Relative: 1 %
HEMATOCRIT: 39.8 % (ref 36.0–46.0)
HEMOGLOBIN: 13.1 g/dL (ref 12.0–15.0)
Lymphocytes Relative: 10 %
Lymphs Abs: 1.8 10*3/uL (ref 0.7–4.0)
MCH: 29.6 pg (ref 26.0–34.0)
MCHC: 32.9 g/dL (ref 30.0–36.0)
MCV: 89.8 fL (ref 78.0–100.0)
MONO ABS: 0.8 10*3/uL (ref 0.1–1.0)
MONOS PCT: 5 %
NEUTROS ABS: 14.3 10*3/uL — AB (ref 1.7–7.7)
Neutrophils Relative %: 84 %
Platelets: 299 10*3/uL (ref 150–400)
RBC: 4.43 MIL/uL (ref 3.87–5.11)
RDW: 12.9 % (ref 11.5–15.5)
WBC: 17 10*3/uL — ABNORMAL HIGH (ref 4.0–10.5)

## 2017-01-17 LAB — COMPREHENSIVE METABOLIC PANEL
ALK PHOS: 80 U/L (ref 38–126)
ALT: 29 U/L (ref 14–54)
AST: 32 U/L (ref 15–41)
Albumin: 3.8 g/dL (ref 3.5–5.0)
Anion gap: 11 (ref 5–15)
BUN: 24 mg/dL — ABNORMAL HIGH (ref 6–20)
CALCIUM: 8.7 mg/dL — AB (ref 8.9–10.3)
CHLORIDE: 101 mmol/L (ref 101–111)
CO2: 25 mmol/L (ref 22–32)
CREATININE: 1.07 mg/dL — AB (ref 0.44–1.00)
GFR calc Af Amer: >60 mL/min (ref >60)
GFR calc non Af Amer: 57 mL/min — ABNORMAL LOW (ref >60)
Glucose, Bld: 212 mg/dL — ABNORMAL HIGH (ref 65–99)
Potassium: 3.6 mmol/L (ref 3.5–5.1)
SODIUM: 137 mmol/L (ref 135–145)
Total Bilirubin: 0.4 mg/dL (ref 0.3–1.2)
Total Protein: 7.7 g/dL (ref 6.5–8.1)

## 2017-01-17 SURGERY — IRRIGATION AND DEBRIDEMENT EXTREMITY
Anesthesia: General | Laterality: Left

## 2017-01-17 MED ORDER — PROMETHAZINE HCL 25 MG/ML IJ SOLN
6.2500 mg | INTRAMUSCULAR | Status: DC | PRN
  Administered 2017-01-17: 6.25 mg via INTRAVENOUS

## 2017-01-17 MED ORDER — ONDANSETRON HCL 4 MG/2ML IJ SOLN
4.0000 mg | Freq: Once | INTRAMUSCULAR | Status: AC
  Administered 2017-01-17: 4 mg via INTRAVENOUS
  Filled 2017-01-17: qty 2

## 2017-01-17 MED ORDER — CEFAZOLIN SODIUM-DEXTROSE 1-4 GM/50ML-% IV SOLN
1.0000 g | Freq: Once | INTRAVENOUS | Status: AC
  Administered 2017-01-17: 1 g via INTRAVENOUS
  Filled 2017-01-17: qty 50

## 2017-01-17 MED ORDER — FENTANYL CITRATE (PF) 100 MCG/2ML IJ SOLN
25.0000 ug | INTRAMUSCULAR | Status: DC | PRN
  Administered 2017-01-17: 50 ug via INTRAVENOUS

## 2017-01-17 MED ORDER — 0.9 % SODIUM CHLORIDE (POUR BTL) OPTIME
TOPICAL | Status: DC | PRN
  Administered 2017-01-17: 1000 mL

## 2017-01-17 MED ORDER — DEXAMETHASONE SODIUM PHOSPHATE 4 MG/ML IJ SOLN
INTRAMUSCULAR | Status: DC | PRN
Start: 2017-01-17 — End: 2017-01-17

## 2017-01-17 MED ORDER — LIDOCAINE HCL (CARDIAC) 20 MG/ML IV SOLN
INTRAVENOUS | Status: DC | PRN
  Administered 2017-01-17: 70 mg via INTRAVENOUS

## 2017-01-17 MED ORDER — CEFAZOLIN SODIUM-DEXTROSE 2-4 GM/100ML-% IV SOLN
INTRAVENOUS | Status: AC
  Filled 2017-01-17: qty 100

## 2017-01-17 MED ORDER — PROMETHAZINE HCL 25 MG/ML IJ SOLN
INTRAMUSCULAR | Status: AC
  Administered 2017-01-17: 6.25 mg via INTRAVENOUS
  Filled 2017-01-17: qty 1

## 2017-01-17 MED ORDER — OXYCODONE-ACETAMINOPHEN 5-325 MG PO TABS: 1.0000 | ORAL_TABLET | ORAL | 0 refills | Status: DC | PRN

## 2017-01-17 MED ORDER — CEFAZOLIN SODIUM-DEXTROSE 2-3 GM-%(50ML) IV SOLR
INTRAVENOUS | Status: DC | PRN
  Administered 2017-01-17: 2 g via INTRAVENOUS

## 2017-01-17 MED ORDER — FENTANYL CITRATE (PF) 250 MCG/5ML IJ SOLN
INTRAMUSCULAR | Status: AC
  Filled 2017-01-17: qty 5

## 2017-01-17 MED ORDER — HYDROMORPHONE HCL 1 MG/ML IJ SOLN
0.5000 mg | Freq: Once | INTRAMUSCULAR | Status: AC
  Administered 2017-01-17: 0.5 mg via INTRAVENOUS
  Filled 2017-01-17: qty 1

## 2017-01-17 MED ORDER — PROPOFOL 10 MG/ML IV BOLUS
INTRAVENOUS | Status: AC
  Filled 2017-01-17: qty 20

## 2017-01-17 MED ORDER — KETOROLAC TROMETHAMINE 30 MG/ML IJ SOLN
30.0000 mg | Freq: Once | INTRAMUSCULAR | Status: DC | PRN
  Administered 2017-01-17: 30 mg via INTRAVENOUS

## 2017-01-17 MED ORDER — LACTATED RINGERS IV SOLN
INTRAVENOUS | Status: DC
  Administered 2017-01-17: 11:00:00 via INTRAVENOUS

## 2017-01-17 MED ORDER — PROPOFOL 10 MG/ML IV BOLUS
INTRAVENOUS | Status: DC | PRN
  Administered 2017-01-17: 200 mg via INTRAVENOUS

## 2017-01-17 MED ORDER — BUPIVACAINE-EPINEPHRINE (PF) 0.25% -1:200000 IJ SOLN
INTRAMUSCULAR | Status: AC
  Filled 2017-01-17: qty 30

## 2017-01-17 MED ORDER — KETOROLAC TROMETHAMINE 30 MG/ML IJ SOLN
INTRAMUSCULAR | Status: AC
  Administered 2017-01-17: 30 mg via INTRAVENOUS
  Filled 2017-01-17: qty 1

## 2017-01-17 MED ORDER — MIDAZOLAM HCL 2 MG/2ML IJ SOLN
INTRAMUSCULAR | Status: AC
  Filled 2017-01-17: qty 2

## 2017-01-17 MED ORDER — FENTANYL CITRATE (PF) 100 MCG/2ML IJ SOLN
50.0000 ug | Freq: Once | INTRAMUSCULAR | Status: AC
  Administered 2017-01-17: 50 ug via INTRAVENOUS
  Filled 2017-01-17: qty 2

## 2017-01-17 MED ORDER — LACTATED RINGERS IV SOLN: INTRAVENOUS | Status: DC

## 2017-01-17 MED ORDER — ONDANSETRON HCL 4 MG/2ML IJ SOLN
INTRAMUSCULAR | Status: DC | PRN
  Administered 2017-01-17: 4 mg via INTRAVENOUS

## 2017-01-17 MED ORDER — BUPIVACAINE-EPINEPHRINE 0.25% -1:200000 IJ SOLN
INTRAMUSCULAR | Status: DC | PRN
  Administered 2017-01-17: 10 mL

## 2017-01-17 MED ORDER — FENTANYL CITRATE (PF) 100 MCG/2ML IJ SOLN
INTRAMUSCULAR | Status: AC
  Administered 2017-01-17: 50 ug via INTRAVENOUS
  Filled 2017-01-17: qty 2

## 2017-01-17 MED ORDER — MIDAZOLAM HCL 5 MG/5ML IJ SOLN
INTRAMUSCULAR | Status: DC | PRN
Start: 2017-01-17 — End: 2017-01-17
  Administered 2017-01-17: 2 mg via INTRAVENOUS

## 2017-01-17 MED ORDER — FENTANYL CITRATE (PF) 250 MCG/5ML IJ SOLN
INTRAMUSCULAR | Status: DC | PRN
  Administered 2017-01-17 (×2): 25 ug via INTRAVENOUS
  Administered 2017-01-17: 50 ug via INTRAVENOUS
  Administered 2017-01-17: 25 ug via INTRAVENOUS

## 2017-01-17 MED ORDER — TETANUS-DIPHTH-ACELL PERTUSSIS 5-2.5-18.5 LF-MCG/0.5 IM SUSP
0.5000 mL | Freq: Once | INTRAMUSCULAR | Status: AC
  Administered 2017-01-17: 0.5 mL via INTRAMUSCULAR
  Filled 2017-01-17: qty 0.5

## 2017-01-17 SURGICAL SUPPLY — 62 items
BANDAGE ACE 3X5.8 VEL STRL LF (GAUZE/BANDAGES/DRESSINGS) IMPLANT
BANDAGE ACE 4X5 VEL STRL LF (GAUZE/BANDAGES/DRESSINGS) ×3 IMPLANT
BIT DRILL 1.1 MINI (BIT) ×1 IMPLANT
BNDG CMPR 9X4 STRL LF SNTH (GAUZE/BANDAGES/DRESSINGS) ×1
BNDG CONFORM 2 STRL LF (GAUZE/BANDAGES/DRESSINGS) IMPLANT
BNDG ESMARK 4X9 LF (GAUZE/BANDAGES/DRESSINGS) ×3 IMPLANT
BNDG GAUZE ELAST 4 BULKY (GAUZE/BANDAGES/DRESSINGS) ×3 IMPLANT
CORDS BIPOLAR (ELECTRODE) ×3 IMPLANT
COVER MAYO STAND STRL (DRAPES) ×3 IMPLANT
COVER SURGICAL LIGHT HANDLE (MISCELLANEOUS) ×3 IMPLANT
CUFF TOURNIQUET SINGLE 18IN (TOURNIQUET CUFF) ×3 IMPLANT
DECANTER SPIKE VIAL GLASS SM (MISCELLANEOUS) IMPLANT
DRAPE OEC MINIVIEW 54X84 (DRAPES) ×3 IMPLANT
DRAPE SURG 17X23 STRL (DRAPES) ×3 IMPLANT
DRILL BIT 1.1 MINI (BIT) ×3
DURAPREP 26ML APPLICATOR (WOUND CARE) IMPLANT
GAUZE PACKING IODOFORM 1/4X15 (GAUZE/BANDAGES/DRESSINGS) IMPLANT
GAUZE PACKING IODOFORM 1/4X5 (PACKING) IMPLANT
GAUZE SPONGE 4X4 12PLY STRL (GAUZE/BANDAGES/DRESSINGS) ×3 IMPLANT
GAUZE XEROFORM 1X8 LF (GAUZE/BANDAGES/DRESSINGS) ×3 IMPLANT
GLOVE SURG SYN 8.0 (GLOVE) ×3 IMPLANT
GOWN STRL REUS W/ TWL LRG LVL3 (GOWN DISPOSABLE) ×2 IMPLANT
GOWN STRL REUS W/ TWL XL LVL3 (GOWN DISPOSABLE) ×1 IMPLANT
GOWN STRL REUS W/TWL LRG LVL3 (GOWN DISPOSABLE) ×6
GOWN STRL REUS W/TWL XL LVL3 (GOWN DISPOSABLE) ×3
KIT BASIN OR (CUSTOM PROCEDURE TRAY) ×3 IMPLANT
KIT ROOM TURNOVER OR (KITS) ×3 IMPLANT
MANIFOLD NEPTUNE II (INSTRUMENTS) IMPLANT
NEEDLE HYPO 25GX1X1/2 BEV (NEEDLE) IMPLANT
NEEDLE HYPO 25X1 1.5 SAFETY (NEEDLE) ×3 IMPLANT
NS IRRIG 1000ML POUR BTL (IV SOLUTION) ×3 IMPLANT
PACK ORTHO EXTREMITY (CUSTOM PROCEDURE TRAY) ×3 IMPLANT
PAD ARMBOARD 7.5X6 YLW CONV (MISCELLANEOUS) ×6 IMPLANT
PAD CAST 3X4 CTTN HI CHSV (CAST SUPPLIES) ×1 IMPLANT
PAD CAST 4YDX4 CTTN HI CHSV (CAST SUPPLIES) IMPLANT
PADDING CAST COTTON 3X4 STRL (CAST SUPPLIES) ×3
PADDING CAST COTTON 4X4 STRL (CAST SUPPLIES)
PADDING CAST SYNTHETIC 4 (CAST SUPPLIES) ×2
PADDING CAST SYNTHETIC 4X4 STR (CAST SUPPLIES) ×1 IMPLANT
PLATE Y 3 8H (Plate) ×3 IMPLANT
SCREW 1.5X11MM (Screw) ×3 IMPLANT
SCREW 1.5X7MM (Screw) ×3 IMPLANT
SCREW 1.5X8MM (Screw) ×9 IMPLANT
SCREW 1.5X9MM (Screw) ×3 IMPLANT
SCREW BN 9X1.5X3XST CRFRM (Screw) ×1 IMPLANT
SPONGE LAP 18X18 X RAY DECT (DISPOSABLE) ×3 IMPLANT
SUT ETHILON 4 0 P 3 18 (SUTURE) ×3 IMPLANT
SUT ETHILON 4 0 PS 2 18 (SUTURE) ×12 IMPLANT
SUT FIBERWIRE 4-0 18 TAPR NDL (SUTURE) ×3
SUT VIC AB 4-0 P-3 18X BRD (SUTURE) ×1 IMPLANT
SUT VIC AB 4-0 P3 18 (SUTURE) ×3
SUT VICRYL RAPIDE 4/0 PS 2 (SUTURE) IMPLANT
SUTURE FIBERWR 4-0 18 TAPR NDL (SUTURE) ×1 IMPLANT
SWAB COLLECTION DEVICE MRSA (MISCELLANEOUS) IMPLANT
SWAB CULTURE ESWAB REG 1ML (MISCELLANEOUS) IMPLANT
SYR CONTROL 10ML LL (SYRINGE) ×3 IMPLANT
TOWEL OR 17X24 6PK STRL BLUE (TOWEL DISPOSABLE) ×3 IMPLANT
TOWEL OR 17X26 10 PK STRL BLUE (TOWEL DISPOSABLE) ×3 IMPLANT
TUBE CONNECTING 12'X1/4 (SUCTIONS) ×1
TUBE CONNECTING 12X1/4 (SUCTIONS) ×2 IMPLANT
UNDERPAD 30X30 (UNDERPADS AND DIAPERS) ×3 IMPLANT
YANKAUER SUCT BULB TIP NO VENT (SUCTIONS) ×3 IMPLANT
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
## 2017-01-17 NOTE — Op Note (Signed)
Please see operative report #[id number]
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
## 2017-01-17 NOTE — ED Notes (Signed)
Patient being transferred [facility] to wait for surgery.

## 2017-01-17 NOTE — Progress Notes (Signed)
Discussed with Dr. Lynelle DoctorKnapp, plan [facility] and repair.  Case posted for later today, Dr. Mina MarbleWeingold also involved.  Likely transfer directly to preop, rather than to floor, depending on timing.  OR notified and will help coordinate to optimize care.   Eulas PostJoshua P Jady Braggs, MD

## 2017-01-17 NOTE — Anesthesia Preprocedure Evaluation (Addendum)
Anesthesia Evaluation  Patient identified by MRN, date of birth, ID band Patient awake    Reviewed: Allergy & Precautions, NPO status , Patient's Chart, lab work & pertinent test results  Airway Mallampati: II  TM Distance: >3 FB Neck ROM: Full    Dental no notable dental hx. (+) Dental Advisory Given, Teeth Intact   Pulmonary neg pulmonary ROS, former smoker,    Pulmonary exam normal breath sounds clear to auscultation       Cardiovascular negative cardio ROS Normal cardiovascular exam Rhythm:Regular Rate:Normal     Neuro/Psych negative neurological ROS  negative psych ROS   GI/Hepatic negative GI ROS, Neg liver ROS,   Endo/Other  negative endocrine ROS  Renal/GU negative Renal ROS  negative genitourinary   Musculoskeletal negative musculoskeletal ROS (+)   Abdominal   Peds negative pediatric ROS (+)  Hematology negative hematology ROS (+)   Anesthesia Other Findings   Reproductive/Obstetrics negative OB ROS                            Anesthesia Physical Anesthesia Plan  ASA: II  Anesthesia Plan: General   Post-op Pain Management:    Induction: Intravenous  PONV Risk Score and Plan: 2 and Ondansetron, Dexamethasone and Treatment may vary due to age or medical condition  Airway Management Planned: LMA and Oral ETT  Additional Equipment:   Intra-op Plan:   Post-operative Plan: Extubation in OR  Informed Consent: I have reviewed the patients History and Physical, chart, labs and discussed the procedure including the risks, benefits and alternatives for the proposed anesthesia with the patient or authorized representative who has indicated his/her understanding and acceptance.   Dental advisory given  Plan Discussed with: CRNA, Surgeon and Anesthesiologist  Anesthesia Plan Comments: (reasses npo status when in short stay. NPO after 0330 hours.)       Anesthesia Quick  Evaluation

## 2017-01-17 NOTE — Progress Notes (Addendum)
Patient had bed assignment upon arrival to PACU. After patient recovered, patient was transported to 5N-14 after report received by 5N staff. Upon arrival to 5N, order for patient to be discharged was seen by staff. Spoke to Dr. Mina MarbleWeingold and patient is supposed to be discharged today. Patient discharged by PACU staff in room 5N-14.

## 2017-01-17 NOTE — Transfer of Care (Signed)
Immediate Anesthesia Transfer of Care Note  Patient: Catherine Chapman  Procedure(s) Performed: IRRIGATION AND DEBRIBEMENT WITH OPEN REDUCTION INTERNAL FIXATION LEFT INDEX FINGER WITH EXTENSOR TENDON REPAIR (Left )  Patient Location: PACU  Anesthesia Type:General  Level of Consciousness: awake, alert , patient cooperative and responds to stimulation  Airway & Oxygen Therapy: Patient Spontanous Breathing and Patient connected to face mask oxygen  Post-op Assessment: Report given to RN, Post -op Vital signs reviewed and stable and Patient moving all extremities X 4  Post vital signs: Reviewed and stable  Last Vitals:  Vitals:   01/17/17 0830 01/17/17 0930  BP: 134/68 135/77  Pulse: 88 82  Resp: 18 18  Temp:  36.8 C  SpO2: 93% 94%    Last Pain:  Vitals:   01/17/17 0830  PainSc: 2          Complications: No apparent anesthesia complications

## 2017-01-17 NOTE — Op Note (Signed)
NAMSherlean Foot:  Chapman, Catherine                 ACCOUNT NO.:  [id number]  MEDICAL RECORD NO.:  [id number]  LOCATION:  [facility]                         FACILITY:  [facility]  PHYSICIAN:  Artist PaisMatthew A. Ky Moskowitz, M.D.DATE OF BIRTH:  03/12/1960  DATE OF PROCEDURE:  01/17/2017 DATE OF DISCHARGE:                              OPERATIVE REPORT   PREOPERATIVE DIAGNOSIS:  Complex open injury, left hand, with open proximal phalangeal fracture, extensor tendon laceration, intermetacarpal ligament laceration.  POSTOPERATIVE DIAGNOSIS:  Complex open injury, left hand, with open proximal phalangeal fracture, extensor tendon laceration, intermetacarpal ligament laceration.  PROCEDURES:  Exploration of above with incision and drainage and primary repair of left index finger proximal phalanx fracture using 1.5-mm T- plate and screws, extensor tendon repair of left index finger, and repair of intermetacarpal ligament between the index and long fingers, left hand.  SURGEON:  Artist PaisMatthew A. Mina MarbleWeingold, MD.  ASSISTANT:  None.  ANESTHESIA:  General.  COMPLICATIONS:  None.  DRAINS:  None.  The patient was taken to the operating suite after induction of adequate general anesthetic.  Left upper extremity was prepped and draped in a sterile fashion.  An Esmarch was used to exsanguinate the limb. Tourniquet was then inflated to 250 mmHg.  At this point, complex laceration involving the web space between the index and long fingers and left hand was explored.  It was extended proximally and distally. Exploration revealed a complete laceration in intermetacarpal ligament between the index and long fingers and the splaying of the digits as well as an open proximal phalangeal fracture at the base of the index finger and extensor tendon repair.  One liter of normal saline was used to irrigate the wound.  We then performed fixation of the proximal phalangeal fracture using a 1.5-mm T-plate with 3 screws proximally and 3 screws  distally across the fracture site under direct and fluoroscopic imaging.  At this point in time, the extensor tendon was repaired using 4-0 FiberWire.  After this was completed, we repaired the intermetacarpal ligament between the index and long fingers using the same 4-0 FiberWire suture.  Exploration revealed intact neurovascular structures and no involvement of the vital structures on the palmar aspect of the hand.  The wound was then irrigated and loosely closed with 4-0 nylon.  Xeroform, 4 x 4's, fluffs, and a dorsal extension block splint were applied.  The patient tolerated these procedures well and went to the recovery room in stable fashion.     Artist PaisMatthew A. Mina MarbleWeingold, M.D.     MAW/MEDQ  D:  01/17/2017  T:  01/17/2017  Job:  [id number]

## 2017-01-17 NOTE — Anesthesia Postprocedure Evaluation (Signed)
Anesthesia Post Note  Patient: Catherine Chapman  Procedure(s) Performed: IRRIGATION AND DEBRIBEMENT WITH OPEN REDUCTION INTERNAL FIXATION LEFT INDEX FINGER WITH EXTENSOR TENDON REPAIR (Left )     Patient location during evaluation: PACU Anesthesia Type: General Level of consciousness: awake and alert Pain management: pain level controlled Vital Signs Assessment: post-procedure vital signs reviewed and stable Respiratory status: spontaneous breathing, nonlabored ventilation, respiratory function stable and patient connected to nasal cannula oxygen Cardiovascular status: blood pressure returned to baseline and stable Postop Assessment: no apparent nausea or vomiting Anesthetic complications: no    Last Vitals:  Vitals:   01/17/17 1515 01/17/17 1520  BP: 139/70 129/66  Pulse: 88 90  Resp: 18 20  Temp:  36.9 C  SpO2: 93% 93%    Last Pain:  Vitals:   01/17/17 1453  PainSc: Asleep                 Meya Clutter S
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
## 2017-01-17 NOTE — Anesthesia Procedure Notes (Signed)
Procedure Name: LMA Insertion Date/Time: 01/17/2017 1:02 PM Performed by: Margarita RanaHoltzman, Tamanna Whitson [place], CRNA Pre-anesthesia Checklist: Patient identified, Emergency Drugs available, Suction available, Timeout performed and Patient being monitored Patient Re-evaluated:Patient Re-evaluated prior to induction Oxygen Delivery Method: Circle system utilized Preoxygenation: Pre-oxygenation with 100% oxygen Induction Type: IV induction Ventilation: Mask ventilation without difficulty LMA: LMA inserted LMA Size: 4.0 Number of attempts: 1 Tube secured with: Tape Dental Injury: Teeth and Oropharynx as per pre-operative assessment

## 2017-01-17 NOTE — ED Notes (Signed)
Saline soaked gauze placed over laceration to left hand.

## 2017-01-17 NOTE — ED Notes (Signed)
[facility] around 10 AM to transport patient to Short Stay.

## 2017-01-18 ENCOUNTER — Encounter (HOSPITAL_COMMUNITY): Payer: Self-pay | Admitting: Orthopedic Surgery

## 2017-09-24 ENCOUNTER — Encounter (HOSPITAL_COMMUNITY): Payer: Self-pay | Admitting: Emergency Medicine

## 2017-09-24 ENCOUNTER — Other Ambulatory Visit: Payer: Self-pay

## 2017-09-24 ENCOUNTER — Emergency Department (HOSPITAL_COMMUNITY): Payer: Self-pay

## 2017-09-24 ENCOUNTER — Emergency Department (HOSPITAL_COMMUNITY)
Admission: EM | Admit: 2017-09-24 | Discharge: 2017-09-24 | Disposition: A | Discharge status: Home / Self Care | Payer: Self-pay | Attending: Emergency Medicine | Admitting: Emergency Medicine

## 2017-09-24 DIAGNOSIS — N390 Urinary tract infection, site not specified: Secondary | ICD-10-CM | POA: Insufficient documentation

## 2017-09-24 DIAGNOSIS — R112 Nausea with vomiting, unspecified: Secondary | ICD-10-CM | POA: Insufficient documentation

## 2017-09-24 DIAGNOSIS — Z87891 Personal history of nicotine dependence: Secondary | ICD-10-CM | POA: Insufficient documentation

## 2017-09-24 LAB — URINALYSIS, ROUTINE W REFLEX MICROSCOPIC
Bilirubin Urine: NEGATIVE
GLUCOSE, UA: NEGATIVE mg/dL
Ketones, ur: NEGATIVE mg/dL
NITRITE: POSITIVE — AB
PROTEIN: 30 mg/dL — AB
SPECIFIC GRAVITY, URINE: 1.016 (ref 1.005–1.030)
WBC, UA: >50 WBC/hpf — ABNORMAL HIGH (ref 0–5)
pH: 6 (ref 5.0–8.0)

## 2017-09-24 LAB — CBC
HEMATOCRIT: 40.3 % (ref 36.0–46.0)
Hemoglobin: 13.6 g/dL (ref 12.0–15.0)
MCH: 29.8 pg (ref 26.0–34.0)
MCHC: 33.7 g/dL (ref 30.0–36.0)
MCV: 88.4 fL (ref 78.0–100.0)
Platelets: 270 10*3/uL (ref 150–400)
RBC: 4.56 MIL/uL (ref 3.87–5.11)
RDW: 12.4 % (ref 11.5–15.5)
WBC: 12.7 10*3/uL — AB (ref 4.0–10.5)

## 2017-09-24 LAB — COMPREHENSIVE METABOLIC PANEL
ALBUMIN: 4 g/dL (ref 3.5–5.0)
ALK PHOS: 71 U/L (ref 38–126)
ALT: 24 U/L (ref 0–44)
ANION GAP: 9 (ref 5–15)
AST: 22 U/L (ref 15–41)
BUN: 11 mg/dL (ref 6–20)
CALCIUM: 9.2 mg/dL (ref 8.9–10.3)
CO2: 27 mmol/L (ref 22–32)
Chloride: 100 mmol/L (ref 98–111)
Creatinine, Ser: 0.95 mg/dL (ref 0.44–1.00)
GFR calc Af Amer: >60 mL/min (ref >60)
GFR calc non Af Amer: >60 mL/min (ref >60)
GLUCOSE: 130 mg/dL — AB (ref 70–99)
Potassium: 3.6 mmol/L (ref 3.5–5.1)
SODIUM: 136 mmol/L (ref 135–145)
TOTAL PROTEIN: 8.3 g/dL — AB (ref 6.5–8.1)
Total Bilirubin: 1.2 mg/dL (ref 0.3–1.2)

## 2017-09-24 MED ORDER — KETOROLAC TROMETHAMINE 60 MG/2ML IM SOLN
30.0000 mg | Freq: Once | INTRAMUSCULAR | Status: AC
  Administered 2017-09-24: 30 mg via INTRAMUSCULAR
  Filled 2017-09-24: qty 2

## 2017-09-24 MED ORDER — ONDANSETRON 8 MG PO TBDP
8.0000 mg | ORAL_TABLET | Freq: Once | ORAL | Status: AC
  Administered 2017-09-24: 8 mg via ORAL
  Filled 2017-09-24: qty 1

## 2017-09-24 MED ORDER — NAPROXEN 250 MG PO TABS: 250.0000 mg | ORAL_TABLET | Freq: Two times a day (BID) | ORAL | 0 refills | Status: DC | PRN

## 2017-09-24 MED ORDER — CEPHALEXIN 500 MG PO CAPS: 500.0000 mg | ORAL_CAPSULE | Freq: Four times a day (QID) | ORAL | 0 refills | Status: DC

## 2017-09-24 MED ORDER — LIDOCAINE HCL (PF) 1 % IJ SOLN
INTRAMUSCULAR | Status: AC
  Filled 2017-09-24: qty 2

## 2017-09-24 MED ORDER — CEFTRIAXONE SODIUM 1 G IJ SOLR
1.0000 g | Freq: Once | INTRAMUSCULAR | Status: AC
  Administered 2017-09-24: 1 g via INTRAMUSCULAR
  Filled 2017-09-24: qty 10

## 2017-09-24 MED ORDER — ONDANSETRON 4 MG PO TBDP: 4.0000 mg | ORAL_TABLET | Freq: Three times a day (TID) | ORAL | 0 refills | Status: DC | PRN

## 2017-09-24 NOTE — Discharge Instructions (Signed)
Take the prescriptions as directed.  Increase your fluid intake (ie:  Gatoraide) for the next few days, as discussed.  Eat a bland diet and advance to your regular diet slowly as you can tolerate it. Call your regular medical doctor tomorrow to schedule a follow up appointment in the next 3 days.  Return to the Emergency Department immediately sooner if worsening.

## 2017-09-24 NOTE — ED Provider Notes (Signed)
[facility] EMERGENCY DEPARTMENT Provider Note   CSN: [id number] Arrival date & time: 09/24/17  1203     History   Chief Complaint Chief Complaint  Patient presents with  . Abdominal Pain    HPI Catherine Chapman is a 57 y.o. female.  HPI  Pt was seen at 1430. Per pt, c/o gradual onset and persistence of constant left sided flank "pain" that began 1 week ago. Has been associated with several intermittent episodes of N/V as well as dysuria. Denies abd pain, no diarrhea, no fevers, no rash, no CP/SOB, no black or blood in stools or emesis.   Past Medical History:  Diagnosis Date  . Back pain     Patient Active Problem List   Diagnosis Date Noted  . Laceration of left hand 01/17/2017    Past Surgical History:  Procedure Laterality Date  . I&D EXTREMITY Left 01/17/2017   Procedure: IRRIGATION AND DEBRIBEMENT WITH OPEN REDUCTION INTERNAL FIXATION LEFT INDEX FINGER WITH EXTENSOR TENDON REPAIR;  Surgeon: Dairl Ponder, MD;  Location: [facility] OR;  Service: Orthopedics;  Laterality: Left;  . KNEE SURGERY    . KNEE SURGERY    . ROTATOR CUFF REPAIR       OB History   None      Home Medications    Prior to Admission medications   Not on File    Family History Family History  Problem Relation Age of Onset  . Diabetes Mother   . Stroke Mother   . Diabetes Father   . Stroke Father   . Diabetes Sister     Social History Social History   Tobacco Use  . Smoking status: Former Smoker    Types: Cigarettes  . Smokeless tobacco: Never Used  Substance Use Topics  . Alcohol use: No  . Drug use: No     Allergies   Tramadol   Review of Systems Review of Systems ROS: Statement: All systems negative except as marked or noted in the HPI; Constitutional: Negative for fever and chills. ; ; Eyes: Negative for eye pain, redness and discharge. ; ; ENMT: Negative for ear pain, hoarseness, nasal congestion, sinus pressure and sore throat. ; ; Cardiovascular: Negative for chest  pain, palpitations, diaphoresis, dyspnea and peripheral edema. ; ; Respiratory: Negative for cough, wheezing and stridor. ; ; Gastrointestinal: Negative for nausea, vomiting, diarrhea, abdominal pain, blood in stool, hematemesis, jaundice and rectal bleeding. . ; ; Genitourinary: +flank pain. Negative for dysuria and hematuria. ; ; Musculoskeletal: Negative for back pain and neck pain. Negative for swelling and trauma.; ; Skin: Negative for pruritus, rash, abrasions, blisters, bruising and skin lesion.; ; Neuro: Negative for headache, lightheadedness and neck stiffness. Negative for weakness, altered level of consciousness, altered mental status, extremity weakness, paresthesias, involuntary movement, seizure and syncope.       Physical Exam Updated Vital Signs BP 129/76 (BP Location: Right Arm)   Pulse 95   Temp 98.2 F (36.8 C) (Oral)   Resp 18   Ht 6' (1.829 m)   Wt 93 kg   SpO2 96%   BMI 27.81 kg/m   Physical Exam 1435: Physical examination:  Nursing notes reviewed; Vital signs and O2 SAT reviewed;  Constitutional: Well developed, Well nourished, Well hydrated, In no acute distress; Head:  Normocephalic, atraumatic; Eyes: EOMI, PERRL, No scleral icterus; ENMT: Mouth and pharynx normal, Mucous membranes moist; Neck: Supple, Full range of motion, No lymphadenopathy; Cardiovascular: Regular rate and rhythm, No gallop; Respiratory: Breath sounds clear &  equal bilaterally, No wheezes.  Speaking full sentences with ease, Normal respiratory effort/excursion; Chest: Nontender, Movement normal; Abdomen: Soft, Nontender, Nondistended, Normal bowel sounds; Genitourinary: No CVA tenderness; Spine:  No midline CS, TS, LS tenderness. +TTP left lumbar paraspinal muscles.;; Extremities: Peripheral pulses normal, No tenderness, No edema, No calf edema or asymmetry.; Neuro: AA&Ox3, Major CN grossly intact.  Speech clear. No gross focal motor or sensory deficits in extremities. Climbs on and off stretcher  easily by herself. Gait steady..; Skin: Color normal, Warm, Dry.    ED Treatments / Results  Labs (all labs ordered are listed, but only abnormal results are displayed)   EKG None  Radiology   Procedures Procedures (including critical care time)  Medications Ordered in ED Medications  ketorolac (TORADOL) injection 30 mg (30 mg Intramuscular Given 09/24/17 1458)  ondansetron (ZOFRAN-ODT) disintegrating tablet 8 mg (8 mg Oral Given 09/24/17 1457)     Initial Impression / Assessment and Plan / ED Course  I have reviewed the triage vital signs and the nursing notes.  Pertinent labs & imaging results that were available during my care of the patient were reviewed by me and considered in my medical decision making (see chart for details).   MDM Reviewed: previous chart, nursing note and vitals Reviewed previous: labs Interpretation: labs and CT scan    Results for orders placed or performed during the hospital encounter of 09/24/17  Comprehensive metabolic panel  Result Value Ref Range   Sodium 136 135 - 145 mmol/L   Potassium 3.6 3.5 - 5.1 mmol/L   Chloride 100 98 - 111 mmol/L   CO2 27 22 - 32 mmol/L   Glucose, Bld 130 (H) 70 - 99 mg/dL   BUN 11 6 - 20 mg/dL   Creatinine, Ser 1.61 0.44 - 1.00 mg/dL   Calcium 9.2 8.9 - 09.6 mg/dL   Total Protein 8.3 (H) 6.5 - 8.1 g/dL   Albumin 4.0 3.5 - 5.0 g/dL   AST 22 15 - 41 U/L   ALT 24 0 - 44 U/L   Alkaline Phosphatase 71 38 - 126 U/L   Total Bilirubin 1.2 0.3 - 1.2 mg/dL   GFR calc non Af Amer >60 >60 mL/min   GFR calc Af Amer >60 >60 mL/min   Anion gap 9 5 - 15  CBC  Result Value Ref Range   WBC 12.7 (H) 4.0 - 10.5 K/uL   RBC 4.56 3.87 - 5.11 MIL/uL   Hemoglobin 13.6 12.0 - 15.0 g/dL   HCT 04.5 40.9 - 81.1 %   MCV 88.4 78.0 - 100.0 fL   MCH 29.8 26.0 - 34.0 pg   MCHC 33.7 30.0 - 36.0 g/dL   RDW 91.4 78.2 - 95.6 %   Platelets 270 150 - 400 K/uL  Urinalysis, Routine w reflex microscopic  Result Value Ref Range    Color, Urine AMBER (A) YELLOW   APPearance TURBID (A) CLEAR   Specific Gravity, Urine 1.016 1.005 - 1.030   pH 6.0 5.0 - 8.0   Glucose, UA NEGATIVE NEGATIVE mg/dL   Hgb urine dipstick MODERATE (A) NEGATIVE   Bilirubin Urine NEGATIVE NEGATIVE   Ketones, ur NEGATIVE NEGATIVE mg/dL   Protein, ur 30 (A) NEGATIVE mg/dL   Nitrite POSITIVE (A) NEGATIVE   Leukocytes, UA MODERATE (A) NEGATIVE   RBC / HPF 6-10 0 - 5 RBC/hpf   WBC, UA >50 (H) 0 - 5 WBC/hpf   Bacteria, UA MANY (A) NONE SEEN   Squamous Epithelial / LPF  0-5 0 - 5   WBC Clumps PRESENT    Mucus PRESENT    Ct Renal Stone Study Result Date: 09/24/2017 CLINICAL DATA:  LEFT flank pain with dysuria EXAM: CT ABDOMEN AND PELVIS WITHOUT CONTRAST TECHNIQUE: Multidetector CT imaging of the abdomen and pelvis was performed following the standard protocol without IV contrast. COMPARISON:  None. FINDINGS: Lower chest: Interstitial thickening.  No acute findings Hepatobiliary: No focal hepatic lesion. No biliary duct dilatation. Gallbladder is normal. Common bile duct is normal. Pancreas: Pancreas is normal. No ductal dilatation. No pancreatic inflammation. Spleen: Normal spleen Adrenals/urinary tract: Adrenal glands normal. Mild inflammatory stranding in the LEFT renal hilum (image 32/2, 64/5). No nephrolithiasis or ureterolithiasis. Hydronephrosis or hydroureter. No obstructing calculus is evident in the LEFT ureter. The RIGHT kidney is normal. No RIGHT nephrolithiasis ureterolithiasis. No bladder calculi. Stomach/Bowel: Small hiatal hernia. Stomach, small-bowel, appendix and cecum normal. The ascending, transverse descending colon normal. Rectum normal. Vascular/Lymphatic: Abdominal aorta is normal caliber. No periportal or retroperitoneal adenopathy. No pelvic adenopathy. Reproductive: Uterus and ovaries normal Other: No free fluid. Musculoskeletal: No aggressive osseous lesion. IMPRESSION: 1. Mild inflammatory stranding in the LEFT renal hilum could  indicate sequelae of a passed renal stone. Mild pyelonephritis could have similar appearance. No ureteral calculi or obstructive uropathy present. No nephrolithiasis. 2. No bladder calculi. 3. Normal appendix. Electronically Signed   By: Genevive Bi M.D.   On: 09/24/2017 15:43    1730:  Pt has tol PO well while in the ED without N/V.  No stooling while in the ED.  Abd remains benign, VSS, remains afebrile. Feels better and wants to go home now. No clear indication for admission at this time. Tx for UTI, UC pending. Dx and testing d/w pt.  Questions answered.  Verb understanding, agreeable to d/c home with outpt f/u.        Final Clinical Impressions(s) / ED Diagnoses   Final diagnoses:  None    ED Discharge Orders    None       Samuel Jester, DO 09/28/17 1610

## 2017-09-24 NOTE — ED Triage Notes (Signed)
Pt reports having left flank pain with dysuria.  Began about a week ago.

## 2017-09-27 LAB — URINE CULTURE

## 2017-09-28 ENCOUNTER — Telehealth: Payer: Self-pay

## 2017-09-28 NOTE — Telephone Encounter (Signed)
Post ED Visit - Positive Culture Follow-up  Culture report reviewed by antimicrobial stewardship pharmacist:  []  Enzo Bi, Pharm.D. []  Celedonio Miyamoto, Pharm.D., BCPS AQ-ID []  Garvin Fila, Pharm.D., BCPS []  Georgina Pillion, Pharm.D., BCPS []  [place], [place].D., BCPS, AAHIVP []  Estella Husk, Pharm.D., BCPS, AAHIVP []  Lysle Pearl, PharmD, BCPS []  Phillips Climes, PharmD, BCPS []  Agapito Games, PharmD, BCPS []  Verlan Friends, PharmD Gwynneth Albright Pharm D Positive urine culture Treated with Cephalexin, organism sensitive to the same and no further patient follow-up is required at this time.  Jerry Caras 09/28/2017, 11:06 AM

## 2019-12-25 DIAGNOSIS — E1165 Type 2 diabetes mellitus with hyperglycemia: Secondary | ICD-10-CM | POA: Insufficient documentation

## 2019-12-25 DIAGNOSIS — E1169 Type 2 diabetes mellitus with other specified complication: Secondary | ICD-10-CM | POA: Insufficient documentation

## 2020-03-03 ENCOUNTER — Other Ambulatory Visit: Payer: Self-pay

## 2020-03-03 ENCOUNTER — Emergency Department (HOSPITAL_COMMUNITY)
Admission: EM | Admit: 2020-03-03 | Discharge: 2020-03-03 | Disposition: A | Discharge status: Home / Self Care | Payer: Medicaid - Out of State | Attending: Emergency Medicine | Admitting: Emergency Medicine

## 2020-03-03 ENCOUNTER — Encounter (HOSPITAL_COMMUNITY): Payer: Self-pay

## 2020-03-03 ENCOUNTER — Ambulatory Visit: Payer: Self-pay

## 2020-03-03 DIAGNOSIS — R059 Cough, unspecified: Secondary | ICD-10-CM | POA: Diagnosis present

## 2020-03-03 DIAGNOSIS — Z87891 Personal history of nicotine dependence: Secondary | ICD-10-CM | POA: Diagnosis not present

## 2020-03-03 DIAGNOSIS — U071 COVID-19: Secondary | ICD-10-CM | POA: Insufficient documentation

## 2020-03-03 LAB — POC SARS CORONAVIRUS 2 AG -  ED: SARS Coronavirus 2 Ag: POSITIVE — AB

## 2020-03-03 MED ORDER — MOLNUPIRAVIR EUA 200MG CAPSULE: 4.0000 | ORAL_CAPSULE | Freq: Two times a day (BID) | ORAL | 0 refills | Status: DC

## 2020-03-03 MED ORDER — MOLNUPIRAVIR EUA 200MG CAPSULE: 4.0000 | ORAL_CAPSULE | Freq: Two times a day (BID) | ORAL | 0 refills | Status: AC

## 2020-03-03 MED ORDER — ALBUTEROL SULFATE HFA 108 (90 BASE) MCG/ACT IN AERS
2.0000 | INHALATION_SPRAY | Freq: Once | RESPIRATORY_TRACT | Status: AC
  Administered 2020-03-03: 2 via RESPIRATORY_TRACT
  Filled 2020-03-03: qty 6.7

## 2020-03-03 NOTE — ED Triage Notes (Signed)
Started having loss of taste that started yesterday. Congestion and slight chest tightness that started today. Had COVID test at work yesterday but results are not back.

## 2020-03-03 NOTE — ED Provider Notes (Signed)
[facility] EMERGENCY DEPARTMENT Provider Note   CSN: [id number] Arrival date & time: 03/03/20  1430     History Chief Complaint  Patient presents with  . Covid Exposure    Catherine Chapman is a 60 y.o. female.  HPI     60 year old female comes in a chief complaint of body aches, URI-like symptoms, cough, loss of taste.  She started having lack of energy yesterday.  Rest of the symptoms followed.  She is not vaccinated against COVID-19.  She does not have any underlying lung disease, heart problems.  Past Medical History:  Diagnosis Date  . Back pain     Patient Active Problem List   Diagnosis Date Noted  . Laceration of left hand 01/17/2017    Past Surgical History:  Procedure Laterality Date  . I & D EXTREMITY Left 01/17/2017   Procedure: IRRIGATION AND DEBRIBEMENT WITH OPEN REDUCTION INTERNAL FIXATION LEFT INDEX FINGER WITH EXTENSOR TENDON REPAIR;  Surgeon: Dairl Ponder, MD;  Location: [facility] OR;  Service: Orthopedics;  Laterality: Left;  . KNEE SURGERY    . KNEE SURGERY    . ROTATOR CUFF REPAIR       OB History   No obstetric history on file.     Family History  Problem Relation Age of Onset  . Diabetes Mother   . Stroke Mother   . Diabetes Father   . Stroke Father   . Diabetes Sister     Social History   Tobacco Use  . Smoking status: Former Smoker    Types: Cigarettes  . Smokeless tobacco: Never Used  Substance Use Topics  . Alcohol use: No  . Drug use: No    Home Medications Prior to Admission medications   Medication Sig Start Date End Date Taking? Authorizing Provider  cephALEXin (KEFLEX) 500 MG capsule Take 1 capsule (500 mg total) by mouth 4 (four) times daily. Patient not taking: Reported on 03/03/2020 09/24/17   Samuel Jester, DO  molnupiravir EUA 200 mg CAPS Take 4 capsules (800 mg total) by mouth 2 (two) times daily for 5 days. 03/03/20 03/08/20  Derwood Kaplan, MD  naproxen (NAPROSYN) 250 MG tablet Take 1 tablet (250 mg total) by  mouth 2 (two) times daily as needed for mild pain or moderate pain (take with food). Patient not taking: Reported on 03/03/2020 09/24/17   Samuel Jester, DO  ondansetron (ZOFRAN ODT) 4 MG disintegrating tablet Take 1 tablet (4 mg total) by mouth every 8 (eight) hours as needed for nausea or vomiting. Patient not taking: Reported on 03/03/2020 09/24/17   Samuel Jester, DO    Allergies    Tramadol  Review of Systems   Review of Systems  Constitutional: Positive for activity change.  HENT: Positive for congestion.   Respiratory: Positive for cough.   Cardiovascular: Negative for chest pain.  Musculoskeletal: Positive for myalgias.  All other systems reviewed and are negative.   Physical Exam Updated Vital Signs BP (!) 153/87   Pulse 87   Temp 99.7 F (37.6 C) (Oral)   Resp (!) 22   Ht 5\' 8"  (1.727 m)   Wt 85.7 kg   SpO2 98%   BMI 28.74 kg/m   Physical Exam Vitals and nursing note reviewed.  Constitutional:      Appearance: She is well-developed.  HENT:     Head: Normocephalic and atraumatic.  Eyes:     Extraocular Movements: EOM normal.  Cardiovascular:     Rate and Rhythm: Normal rate.  Pulmonary:  Effort: Pulmonary effort is normal.  Abdominal:     General: Bowel sounds are normal.  Musculoskeletal:     Cervical back: Normal range of motion and neck supple.  Skin:    General: Skin is warm and dry.  Neurological:     Mental Status: She is alert and oriented to person, place, and time.     ED Results / Procedures / Treatments   Labs (all labs ordered are listed, but only abnormal results are displayed) Labs Reviewed  POC SARS CORONAVIRUS 2 AG -  ED - Abnormal; Notable for the following components:      Result Value   SARS Coronavirus 2 Ag Positive (*)    All other components within normal limits    EKG None  Radiology No results found.  Procedures Procedures   Medications Ordered in ED Medications  albuterol (VENTOLIN HFA) 108 (90 Base)  MCG/ACT inhaler 2 puff (has no administration in time range)    ED Course  I have reviewed the triage vital signs and the nursing notes.  Pertinent labs & imaging results that were available during my care of the patient were reviewed by me and considered in my medical decision making (see chart for details).    MDM Rules/Calculators/A&P                          EARLY ORD was evaluated in Emergency Department on 03/03/2020 for the symptoms described in the history of present illness. She was evaluated in the context of the global COVID-19 pandemic, which necessitated consideration that the patient might be at risk for infection with the SARS-CoV-2 virus that causes COVID-19. Institutional protocols and algorithms that pertain to the evaluation of patients at risk for COVID-19 are in a state of rapid change based on information released by regulatory bodies including the CDC and federal and state organizations. These policies and algorithms were followed during the patient's care in the ED.   60 year old female comes in a chief complaint of URI-like symptoms, body aches and loss of taste.  She is unvaccinated.  Her COVID-19 test is positive.  Fortunately, she does not have a lot of comorbidities and currently her hemodynamics appear stable.  We will give her inhaler to be used as needed. We will give her prescription for Molnupiravir.  Strict ER return precautions have been discussed, and patient is agreeing with the plan and is comfortable with the workup done and the recommendations from the ER.   Final Clinical Impression(s) / ED Diagnoses Final diagnoses:  COVID-19    Rx / DC Orders ED Discharge Orders         Ordered    molnupiravir EUA 200 mg CAPS  2 times daily,   Status:  Discontinued        03/03/20 1544    molnupiravir EUA 200 mg CAPS  2 times daily        03/03/20 1547           Derwood Kaplan, MD 03/03/20 1551

## 2020-03-03 NOTE — Discharge Instructions (Signed)
The test confirms that you have COVID-19. We are prescribing you with an antiviral pill that might help.  Please read the instructions provided for this medication.  Please call pharmacy ahead of time to ensure they have it available.  If they do not have it available at their pharmacy, consider contacting [facility] for this medicine.  Use the albuterol every 4-6 hours for chest tightness, wheezing. We recommend that you take baby aspirin daily for the next 2 weeks.  Return to the ER if your symptoms get worse.

## 2020-04-22 ENCOUNTER — Ambulatory Visit (INDEPENDENT_AMBULATORY_CARE_PROVIDER_SITE_OTHER): Payer: Self-pay | Admitting: Family Medicine

## 2020-04-22 ENCOUNTER — Encounter: Payer: Self-pay | Admitting: Family Medicine

## 2020-04-22 ENCOUNTER — Other Ambulatory Visit: Payer: Self-pay

## 2020-04-22 VITALS — BP 125/76 | HR 88 | Temp 98.2°F | Ht 68.0 in | Wt 179.0 lb

## 2020-04-22 DIAGNOSIS — E1165 Type 2 diabetes mellitus with hyperglycemia: Secondary | ICD-10-CM

## 2020-04-22 DIAGNOSIS — Z5989 Other problems related to housing and economic circumstances: Secondary | ICD-10-CM

## 2020-04-22 MED ORDER — EMPAGLIFLOZIN 10 MG PO TABS: 10.0000 mg | ORAL_TABLET | Freq: Every day | ORAL | 2 refills | Status: DC

## 2020-04-22 MED ORDER — BLOOD GLUCOSE MONITOR KIT: PACK | 0 refills | Status: DC

## 2020-04-22 NOTE — Patient Instructions (Addendum)
[facility] in [facility] in front of [address]  Please schedule your diabetic eye exam.   Diabetes Mellitus and Nutrition, Adult When you have diabetes, or diabetes mellitus, it is very important to have healthy eating habits because your blood sugar (glucose) levels are greatly affected by what you eat and drink. Eating healthy foods in the right amounts, at about the same times every day, can help you:  Control your blood glucose.  Lower your risk of heart disease.  Improve your blood pressure.  Reach or maintain a healthy weight. What can affect my meal plan? Every person with diabetes is different, and each person has different needs for a meal plan. Your health care provider may recommend that you work with a dietitian to make a meal plan that is best for you. Your meal plan may vary depending on factors such as:  The calories you need.  The medicines you take.  Your weight.  Your blood glucose, blood pressure, and cholesterol levels.  Your activity level.  Other health conditions you have, such as heart or kidney disease. How do carbohydrates affect me? Carbohydrates, also called carbs, affect your blood glucose level more than any other type of food. Eating carbs naturally raises the amount of glucose in your blood. Carb counting is a method for keeping track of how many carbs you eat. Counting carbs is important to keep your blood glucose at a healthy level, especially if you use insulin or take certain oral diabetes medicines. It is important to know how many carbs you can safely have in each meal. This is different for every person. Your dietitian can help you calculate how many carbs you should have at each meal and for each snack. How does alcohol affect me? Alcohol can cause a sudden decrease in blood glucose (hypoglycemia), especially if you use insulin or take certain oral diabetes medicines. Hypoglycemia can be a life-threatening condition. Symptoms of  hypoglycemia, such as sleepiness, dizziness, and confusion, are similar to symptoms of having too much alcohol.  Do not drink alcohol if: ? Your health care provider tells you not to drink. ? You are pregnant, may be pregnant, or are planning to become pregnant.  If you drink alcohol: ? Do not drink on an empty stomach. ? Limit how much you use to:  0-1 drink a day for women.  0-2 drinks a day for men. ? Be aware of how much alcohol is in your drink. In the [place] one drink equals one 12 oz bottle of beer (355 mL), one 5 oz glass of wine (148 mL), or one 1 oz glass of hard liquor (44 mL). ? Keep yourself hydrated with water, diet soda, or unsweetened iced tea.  Keep in mind that regular soda, juice, and other mixers may contain a lot of sugar and must be counted as carbs. What are tips for following this plan? Reading food labels  Start by checking the serving size on the "Nutrition Facts" label of packaged foods and drinks. The amount of calories, carbs, fats, and other nutrients listed on the label is based on one serving of the item. Many items contain more than one serving per package.  Check the total grams (g) of carbs in one serving. You can calculate the number of servings of carbs in one serving by dividing the total carbs by 15. For example, if a food has 30 g of total carbs per serving, it would be equal to 2 servings of carbs.  Check the number of grams (g) of saturated fats and trans fats in one serving. Choose foods that have a low amount or none of these fats.  Check the number of milligrams (mg) of salt (sodium) in one serving. Most people should limit total sodium intake to less than 2,300 mg per day.  Always check the nutrition information of foods labeled as "low-fat" or "nonfat." These foods may be higher in added sugar or refined carbs and should be avoided.  Talk to your dietitian to identify your daily goals for nutrients listed on the label. Shopping  Avoid  buying canned, pre-made, or processed foods. These foods tend to be high in fat, sodium, and added sugar.  Shop around the outside edge of the grocery store. This is where you will most often find fresh fruits and vegetables, bulk grains, fresh meats, and fresh dairy. Cooking  Use low-heat cooking methods, such as baking, instead of high-heat cooking methods like deep frying.  Cook using healthy oils, such as olive, canola, or sunflower oil.  Avoid cooking with butter, cream, or high-fat meats. Meal planning  Eat meals and snacks regularly, preferably at the same times every day. Avoid going long periods of time without eating.  Eat foods that are high in fiber, such as fresh fruits, vegetables, beans, and whole grains. Talk with your dietitian about how many servings of carbs you can eat at each meal.  Eat 4-6 oz (112-168 g) of lean protein each day, such as lean meat, chicken, fish, eggs, or tofu. One ounce (oz) of lean protein is equal to: ? 1 oz (28 g) of meat, chicken, or fish. ? 1 egg. ?  cup (62 g) of tofu.  Eat some foods each day that contain healthy fats, such as avocado, nuts, seeds, and fish.   What foods should I eat? Fruits Berries. Apples. Oranges. Peaches. Apricots. Plums. Grapes. Mango. Papaya. Pomegranate. Kiwi. Cherries. Vegetables Lettuce. Spinach. Leafy greens, including kale, chard, collard greens, and mustard greens. Beets. Cauliflower. Cabbage. Broccoli. Carrots. Green beans. Tomatoes. Peppers. Onions. Cucumbers. Brussels sprouts. Grains Whole grains, such as whole-wheat or whole-grain bread, crackers, tortillas, cereal, and pasta. Unsweetened oatmeal. Quinoa. Boulay or wild rice. Meats and other proteins Seafood. Poultry without skin. Lean cuts of poultry and beef. Tofu. Nuts. Seeds. Dairy Low-fat or fat-free dairy products such as milk, yogurt, and cheese. The items listed above may not be a complete list of foods and beverages you can eat. Contact a  dietitian for more information. What foods should I avoid? Fruits Fruits canned with syrup. Vegetables Canned vegetables. Frozen vegetables with butter or cream sauce. Grains Refined white flour and flour products such as bread, pasta, snack foods, and cereals. Avoid all processed foods. Meats and other proteins Fatty cuts of meat. Poultry with skin. Breaded or fried meats. Processed meat. Avoid saturated fats. Dairy Full-fat yogurt, cheese, or milk. Beverages Sweetened drinks, such as soda or iced tea. The items listed above may not be a complete list of foods and beverages you should avoid. Contact a dietitian for more information. Questions to ask a health care provider  Do I need to meet with a diabetes educator?  Do I need to meet with a dietitian?  What number can I call if I have questions?  When are the best times to check my blood glucose? Where to find more information:  American Diabetes Association: diabetes.org  Academy of Nutrition and Dietetics: www.eatright.[employer] of Diabetes and Digestive and Kidney Diseases:  [employer]  Association of Diabetes Care and Education Specialists: www.diabeteseducator.org Summary  It is important to have healthy eating habits because your blood sugar (glucose) levels are greatly affected by what you eat and drink.  A healthy meal plan will help you control your blood glucose and maintain a healthy lifestyle.  Your health care provider may recommend that you work with a dietitian to make a meal plan that is best for you.  Keep in mind that carbohydrates (carbs) and alcohol have immediate effects on your blood glucose levels. It is important to count carbs and to use alcohol carefully. This information is not intended to replace advice given to you by your health care provider. Make sure you discuss any questions you have with your health care provider. Document Revised: 12/17/2018 Document Reviewed:  12/17/2018 Elsevier Patient Education  2021 [employer].

## 2020-04-22 NOTE — Progress Notes (Signed)
New Patient Office Visit  Assessment & Plan:  1. Type 2 diabetes mellitus with hyperglycemia, without long-term current use of insulin ([place]) - Diabetes is not well controlled. Discussed future addition of statin, foot exams, eye exams, urine microalbumin/creatinine ratio, and pneumonia vaccine. - Medications: started patient on Jardiance.  - Home glucose monitoring: rx'd glucometer - Patient is not currently taking a statin. Patient is not taking an ACE-inhibitor/ARB.  - Instruction/counseling given: reminded to get eye exam, discussed diet and provided printed educational material  Diabetes Health Maintenance Due  Topic Date Due  . HEMOGLOBIN A1C  Never done  . FOOT EXAM  Never done  . OPHTHALMOLOGY EXAM  Never done  . URINE MICROALBUMIN  Never done    - empagliflozin (JARDIANCE) 10 MG TABS tablet; Take 1 tablet (10 mg total) by mouth daily before breakfast.  Dispense: 30 tablet; Refill: 2 - blood glucose meter kit and supplies KIT; Dispense based on patient and insurance preference. Use up to four times daily as directed.  Dispense: 1 each; Refill: 0 - CBC with Differential/Platelet; Future - CMP14+EGFR; Future - Lipid panel; Future - Microalbumin / creatinine urine ratio; Future - Bayer DCA Hb A1c Waived; Future  2. Does not have health insurance Patient will have health insurance tomorrow. Labs ordered as future for patient to return next week.    Follow-up: Return in about 6 weeks (around 06/03/2020) for DM & health maintenance.   Catherine Limes, MSN, APRN, FNP-C [facility]  Subjective:  Patient ID: Catherine Chapman, female    DOB: [birth year]  Age: 60 y.o. MRN: [id number]  Patient Care Team: Patient, No Pcp Per (Inactive) as PCP - General (General Practice)  CC:  Chief Complaint  Patient presents with  . New Patient (Initial Visit)    No previous PCP  . Establish Care  . Diabetes    Wants to be checked     HPI GISSELLA Chapman presents to  establish care.   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 Upon chart review, she had a glucose of 242 last December.   Patient has new onset of diabetes at the age of 33. She reports she is eating a healthy diet and is walking regularly. She is not currently on any medications.    Review of Systems  Constitutional: Negative for chills, fever, malaise/fatigue and weight loss.  HENT: Negative for congestion, ear discharge, ear pain, nosebleeds, sinus pain, sore throat and tinnitus.   Eyes: Negative for blurred vision, double vision, pain, discharge and redness.  Respiratory: Negative for cough, shortness of breath and wheezing.   Cardiovascular: Negative for chest pain, palpitations and leg swelling.  Gastrointestinal: Negative for abdominal pain, constipation, diarrhea, heartburn, nausea and vomiting.  Genitourinary: Negative for dysuria, frequency and urgency.  Musculoskeletal: Negative for myalgias.  Skin: Negative for rash.  Neurological: Negative for dizziness, seizures, weakness and headaches.  Psychiatric/Behavioral: Negative for depression, substance abuse and suicidal ideas. The patient is not nervous/anxious.    No current outpatient medications on file.  Allergies  Allergen Reactions  . Tramadol Nausea And Vomiting    Past Medical History:  Diagnosis Date  . Back pain   . Diabetes [facility])     Past Surgical History:  Procedure Laterality Date  . I & D EXTREMITY Left 01/17/2017   Procedure: IRRIGATION AND DEBRIBEMENT WITH OPEN REDUCTION  INTERNAL FIXATION LEFT INDEX FINGER WITH EXTENSOR TENDON REPAIR;  Surgeon: Charlotte Crumb, MD;  Location: [place];  Service: Orthopedics;  Laterality: Left;  . KNEE SURGERY    . KNEE SURGERY    . ROTATOR CUFF REPAIR      Family History  Problem Relation Age of Onset  . Diabetes Mother   . Stroke Mother   . Diabetes  Father   . Stroke Father   . Diabetes Sister   . Lupus Daughter   . Diabetes Maternal Grandmother   . Stomach cancer Maternal Grandfather   . Hypertension Paternal Grandmother   . Diabetes Sister     Social History   Socioeconomic History  . Marital status: Single    Spouse name: Not on file  . Number of children: Not on file  . Years of education: Not on file  . Highest education level: Not on file  Occupational History  . Not on file  Tobacco Use  . Smoking status: Former Smoker    Types: Cigarettes    Quit date: 1990    Years since quitting: 32.2  . Smokeless tobacco: Never Used  Vaping Use  . Vaping Use: Never used  Substance and Sexual Activity  . Alcohol use: Yes    Comment: occ  . Drug use: No  . Sexual activity: Yes    Birth control/protection: None  Other Topics Concern  . Not on file  Social History Narrative  . Not on file   Social Determinants of Health   Financial Resource Strain: Not on file  Food Insecurity: Not on file  Transportation Needs: Not on file  Physical Activity: Not on file  Stress: Not on file  Social Connections: Not on file  Intimate Partner Violence: Not on file    Objective:   Today's Vitals: BP 125/76   Pulse 88   Temp 98.2 F (36.8 C) (Temporal)   Ht $R'5\' 8"'DT$  (1.727 m)   Wt 179 lb (81.2 kg)   SpO2 98%   BMI 27.22 kg/m   Physical Exam Vitals reviewed.  Constitutional:      General: She is not in acute distress.    Appearance: Normal appearance. She is overweight. She is not ill-appearing, toxic-appearing or diaphoretic.  HENT:     Head: Normocephalic and atraumatic.  Eyes:     General: No scleral icterus.       Right eye: No discharge.        Left eye: No discharge.     Conjunctiva/sclera: Conjunctivae normal.  Cardiovascular:     Rate and Rhythm: Normal rate and regular rhythm.     Heart sounds: Normal heart sounds. No murmur heard. No friction rub. No gallop.   Pulmonary:     Effort: Pulmonary effort is  normal. No respiratory distress.     Breath sounds: Normal breath sounds. No stridor. No wheezing, rhonchi or rales.  Musculoskeletal:        General: Normal range of motion.     Cervical back: Normal range of motion.  Skin:    General: Skin is warm and dry.     Capillary Refill: Capillary refill takes less than 2 seconds.  Neurological:     General: No focal deficit present.     Mental Status: She is alert and oriented to person, place, and time. Mental status is at baseline.  Psychiatric:        Mood and Affect: Mood normal.        Behavior: Behavior normal.  Thought Content: Thought content normal.        Judgment: Judgment normal.

## 2020-05-24 ENCOUNTER — Other Ambulatory Visit: Payer: BC Managed Care – PPO

## 2020-05-24 ENCOUNTER — Telehealth: Payer: Self-pay

## 2020-05-24 DIAGNOSIS — E1165 Type 2 diabetes mellitus with hyperglycemia: Secondary | ICD-10-CM

## 2020-05-24 LAB — CMP14+EGFR

## 2020-05-24 LAB — CBC WITH DIFFERENTIAL/PLATELET
Basophils Absolute: 0 10*3/uL (ref 0.0–0.2)
Hematocrit: 42.6 % (ref 34.0–46.6)
Immature Grans (Abs): 0.1 10*3/uL (ref 0.0–0.1)
Immature Granulocytes: 1 %
MCH: 29.2 pg (ref 26.6–33.0)
Monocytes: 6 %
Neutrophils: 76 %
RDW: 12.1 % (ref 11.7–15.4)

## 2020-05-24 LAB — BAYER DCA HB A1C WAIVED: HB A1C (BAYER DCA - WAIVED): >14 % — ABNORMAL HIGH (ref <7.0)

## 2020-05-24 LAB — LIPID PANEL

## 2020-05-24 MED ORDER — LANTUS SOLOSTAR 100 UNIT/ML ~~LOC~~ SOPN: 15.0000 [IU] | PEN_INJECTOR | Freq: Every day | SUBCUTANEOUS | 1 refills | Status: DC

## 2020-05-24 NOTE — Progress Notes (Signed)
Patient aware and verbalizes understanding. 

## 2020-05-24 NOTE — Progress Notes (Signed)
Please let patient know I am sending in Lantus for her to start at 15 units/day.  This is a long-acting insulin.  Her A1c was >14.  Please keep appointment with me next week.

## 2020-05-25 ENCOUNTER — Telehealth: Payer: Self-pay

## 2020-05-25 ENCOUNTER — Other Ambulatory Visit: Payer: Self-pay

## 2020-05-25 DIAGNOSIS — E1165 Type 2 diabetes mellitus with hyperglycemia: Secondary | ICD-10-CM

## 2020-05-25 DIAGNOSIS — E1169 Type 2 diabetes mellitus with other specified complication: Secondary | ICD-10-CM

## 2020-05-25 DIAGNOSIS — Z794 Long term (current) use of insulin: Secondary | ICD-10-CM

## 2020-05-25 LAB — CMP14+EGFR
ALT: 29 IU/L (ref 0–32)
AST: 22 IU/L (ref 0–40)
Albumin/Globulin Ratio: 1.1 — ABNORMAL LOW (ref 1.2–2.2)
Albumin: 4.1 g/dL (ref 3.8–4.9)
BUN/Creatinine Ratio: 20 (ref 9–23)
Bilirubin Total: 0.4 mg/dL (ref 0.0–1.2)
CO2: 20 mmol/L (ref 20–29)
Calcium: 9.3 mg/dL (ref 8.7–10.2)
Chloride: 103 mmol/L (ref 96–106)
Globulin, Total: 3.6 g/dL (ref 1.5–4.5)
Glucose: 151 mg/dL — ABNORMAL HIGH (ref 65–99)
Potassium: 4.4 mmol/L (ref 3.5–5.2)
Sodium: 139 mmol/L (ref 134–144)
Total Protein: 7.7 g/dL (ref 6.0–8.5)

## 2020-05-25 LAB — CBC WITH DIFFERENTIAL/PLATELET
Basos: 0 %
EOS (ABSOLUTE): 0.2 10*3/uL (ref 0.0–0.4)
Eos: 2 %
Hemoglobin: 14 g/dL (ref 11.1–15.9)
Lymphocytes Absolute: 1.2 10*3/uL (ref 0.7–3.1)
Lymphs: 15 %
MCHC: 32.9 g/dL (ref 31.5–35.7)
MCV: 89 fL (ref 79–97)
Monocytes Absolute: 0.5 10*3/uL (ref 0.1–0.9)
Neutrophils Absolute: 6.2 10*3/uL (ref 1.4–7.0)
Platelets: 301 10*3/uL (ref 150–450)
RBC: 4.79 x10E6/uL (ref 3.77–5.28)
WBC: 8.1 10*3/uL (ref 3.4–10.8)

## 2020-05-25 LAB — LIPID PANEL
Chol/HDL Ratio: 4.2 ratio (ref 0.0–4.4)
Cholesterol, Total: 227 mg/dL — ABNORMAL HIGH (ref 100–199)
LDL Chol Calc (NIH): 159 mg/dL — ABNORMAL HIGH (ref 0–99)
Triglycerides: 78 mg/dL (ref 0–149)
VLDL Cholesterol Cal: 14 mg/dL (ref 5–40)

## 2020-05-25 MED ORDER — LANTUS SOLOSTAR 100 UNIT/ML ~~LOC~~ SOPN: 15.0000 [IU] | PEN_INJECTOR | Freq: Every day | SUBCUTANEOUS | 1 refills | Status: DC

## 2020-05-25 MED ORDER — INSULIN PEN NEEDLE 32G X 4 MM MISC: 3 refills | Status: AC

## 2020-05-25 MED ORDER — INSULIN GLARGINE 100 UNIT/ML ~~LOC~~ SOLN: 15.0000 [IU] | Freq: Every day | SUBCUTANEOUS | 2 refills | Status: DC

## 2020-05-25 NOTE — Telephone Encounter (Signed)
The pharmacy would like for you to send in a new prescription for the [facility].

## 2020-05-25 NOTE — Telephone Encounter (Signed)
Yes

## 2020-05-25 NOTE — Telephone Encounter (Signed)
Done

## 2020-05-25 NOTE — Telephone Encounter (Signed)
Pen needles sent to [facility] pharmacy Please change Semglee to pens

## 2020-05-25 NOTE — Telephone Encounter (Signed)
Insulin sent to [facility] in [place]

## 2020-05-25 NOTE — Telephone Encounter (Signed)
We do not have pens in the epic system for me to prescribe.  If they want to take a verbal order I am fine with that.

## 2020-05-25 NOTE — Telephone Encounter (Signed)
Attempted to call Stew back - NA when calling pharmacy

## 2020-05-26 MED ORDER — INSULIN GLARGINE 100 UNIT/ML ~~LOC~~ SOLN: 15.0000 [IU] | Freq: Every day | SUBCUTANEOUS | 2 refills | Status: DC

## 2020-05-26 NOTE — Telephone Encounter (Signed)
Lmtcb.

## 2020-05-26 NOTE — Telephone Encounter (Signed)
Medication sent to [facility] pharmacy and pharmacy aware pens Is fine

## 2020-05-31 NOTE — Telephone Encounter (Signed)
done

## 2020-06-02 ENCOUNTER — Ambulatory Visit: Payer: Medicaid - Out of State | Admitting: Family Medicine

## 2020-06-11 ENCOUNTER — Encounter: Payer: Self-pay | Admitting: Family Medicine

## 2020-06-11 ENCOUNTER — Ambulatory Visit: Payer: BC Managed Care – PPO | Admitting: Family Medicine

## 2020-06-11 ENCOUNTER — Other Ambulatory Visit: Payer: Self-pay

## 2020-06-11 VITALS — BP 139/72 | HR 89 | Temp 97.1°F | Ht 68.0 in | Wt 178.8 lb

## 2020-06-11 DIAGNOSIS — E1165 Type 2 diabetes mellitus with hyperglycemia: Secondary | ICD-10-CM

## 2020-06-11 DIAGNOSIS — Z0000 Encounter for general adult medical examination without abnormal findings: Secondary | ICD-10-CM

## 2020-06-11 MED ORDER — EMPAGLIFLOZIN 10 MG PO TABS: 10.0000 mg | ORAL_TABLET | Freq: Every day | ORAL | 2 refills | Status: DC

## 2020-06-11 MED ORDER — ATORVASTATIN CALCIUM 10 MG PO TABS: 10.0000 mg | ORAL_TABLET | Freq: Every day | ORAL | 2 refills | Status: DC

## 2020-06-11 NOTE — Progress Notes (Signed)
Assessment & Plan:  1. Type 2 diabetes mellitus with hyperglycemia, without long-term current use of insulin (HCC) Uncontrolled but seems to be improving based on blood glucose readings at home. Statin added today. Discussed since her readings are doing so well and she is doing great with her diet she can continue Jardiance and not Chapman the insulin. Patient to schedule diabetic eye exam.  - Microalbumin / creatinine urine ratio - empagliflozin (JARDIANCE) 10 MG TABS tablet; Take 1 tablet (10 mg total) by mouth daily before breakfast.  Dispense: 30 tablet; Refill: 2 - atorvastatin (LIPITOR) 10 MG tablet; Take 1 tablet (10 mg total) by mouth daily.  Dispense: 30 tablet; Refill: 2  2. Healthcare maintenance Patient to schedule mammogram on the bus and her pap smear here with me.    Return in about 2 months (around 08/24/2020) for annual physical w. pap .  Hendricks Limes, MSN, APRN, FNP-C [facility]  Subjective:    Patient ID: Catherine Chapman, female    DOB: 05-22-1960, 60 y.o.   MRN: [id number]  Patient Care Team: Loman Brooklyn, FNP as PCP - General (Family Medicine)   Chief Complaint:  Chief Complaint  Patient presents with  . Diabetes    6 week follow up    HPI: Catherine Chapman is a 60 y.o. female presenting on 06/11/2020 for Diabetes (6 week follow up)  Patient is following up on diabetes. She has been checking her blood sugar 4-5 times per day since she was last here. She reports readings 98-125 before meals, ~180 after meals, and 100-110 a few hours after meals. States she has really changed her diet and started eating healthy since her last visit. She does a lot of walking, lifting, and bending at work. She has not yet started the insulin as she wanted to discuss due to good blood glucose readings.   The 10-year ASCVD risk score Mikey Bussing DC Brooke Bonito., et al., 2013) is: 15.6%  New complaints: None  Social history:  Relevant past medical, surgical, family and  social history reviewed and updated as indicated. Interim medical history since our last visit reviewed.  Allergies and medications reviewed and updated.  DATA REVIEWED: CHART IN EPIC  ROS: Negative unless specifically indicated above in HPI.    Current Outpatient Medications:  .  blood glucose meter kit and supplies KIT, Dispense based on patient and insurance preference. Use up to four times daily as directed., Disp: 1 each, Rfl: 0 .  empagliflozin (JARDIANCE) 10 MG TABS tablet, Take 1 tablet (10 mg total) by mouth daily before breakfast., Disp: 30 tablet, Rfl: 2 .  Insulin Pen Needle 32G X 4 MM MISC, Use with insulin daily Dx E11.69, Disp: 100 each, Rfl: 3 .  insulin glargine (SEMGLEE) 100 UNIT/ML injection, Inject 0.15 mLs (15 Units total) into the skin daily. (Patient not taking: Reported on 06/11/2020), Disp: 10 mL, Rfl: 2   Allergies  Allergen Reactions  . Tramadol Nausea And Vomiting   Past Medical History:  Diagnosis Date  . Back pain   . Diabetes [facility])     Past Surgical History:  Procedure Laterality Date  . I & D EXTREMITY Left 01/17/2017   Procedure: IRRIGATION AND DEBRIBEMENT WITH OPEN REDUCTION INTERNAL FIXATION LEFT INDEX FINGER WITH EXTENSOR TENDON REPAIR;  Surgeon: Charlotte Crumb, MD;  Location: [place];  Service: Orthopedics;  Laterality: Left;  . KNEE SURGERY    . KNEE SURGERY    . ROTATOR CUFF REPAIR  Social History   Socioeconomic History  . Marital status: Single    Spouse name: Not on file  . Number of children: Not on file  . Years of education: Not on file  . Highest education level: Not on file  Occupational History  . Not on file  Tobacco Use  . Smoking status: Former Smoker    Types: Cigarettes    Quit date: 1990    Years since quitting: 32.4  . Smokeless tobacco: Never Used  Vaping Use  . Vaping Use: Never used  Substance and Sexual Activity  . Alcohol use: Yes    Comment: occ  . Drug use: No  . Sexual activity: Yes    Birth  control/protection: None  Other Topics Concern  . Not on file  Social History Narrative  . Not on file   Social Determinants of Health   Financial Resource Strain: Not on file  Food Insecurity: Not on file  Transportation Needs: Not on file  Physical Activity: Not on file  Stress: Not on file  Social Connections: Not on file  Intimate Partner Violence: Not on file        Objective:    BP 139/72   Pulse 89   Temp (!) 97.1 F (36.2 C) (Temporal)   Ht '5\' 8"'  (1.727 m)   Wt 178 lb 12.8 oz (81.1 kg)   SpO2 97%   BMI 27.19 kg/m   Wt Readings from Last 3 Encounters:  06/11/20 178 lb 12.8 oz (81.1 kg)  04/22/20 179 lb (81.2 kg)  03/03/20 189 lb (85.7 kg)    Physical Exam Vitals reviewed.  Constitutional:      General: She is not in acute distress.    Appearance: Normal appearance. She is overweight. She is not ill-appearing, toxic-appearing or diaphoretic.  HENT:     Head: Normocephalic and atraumatic.  Eyes:     General: No scleral icterus.       Right eye: No discharge.        Left eye: No discharge.     Conjunctiva/sclera: Conjunctivae normal.  Cardiovascular:     Rate and Rhythm: Normal rate and regular rhythm.     Heart sounds: Normal heart sounds. No murmur heard. No friction rub. No gallop.   Pulmonary:     Effort: Pulmonary effort is normal. No respiratory distress.     Breath sounds: Normal breath sounds. No stridor. No wheezing, rhonchi or rales.  Musculoskeletal:        General: Normal range of motion.     Cervical back: Normal range of motion.  Skin:    General: Skin is warm and dry.     Capillary Refill: Capillary refill takes less than 2 seconds.  Neurological:     General: No focal deficit present.     Mental Status: She is alert and oriented to person, place, and time. Mental status is at baseline.  Psychiatric:        Mood and Affect: Mood normal.        Behavior: Behavior normal.        Thought Content: Thought content normal.         Judgment: Judgment normal.     No results found for: TSH Lab Results  Component Value Date   WBC 8.1 05/24/2020   HGB 14.0 05/24/2020   HCT 42.6 05/24/2020   MCV 89 05/24/2020   PLT 301 05/24/2020   Lab Results  Component Value Date   NA 139 05/24/2020  K 4.4 05/24/2020   CO2 20 05/24/2020   GLUCOSE 151 (H) 05/24/2020   BUN 16 05/24/2020   CREATININE 0.80 05/24/2020   BILITOT 0.4 05/24/2020   ALKPHOS 98 05/24/2020   AST 22 05/24/2020   ALT 29 05/24/2020   PROT 7.7 05/24/2020   ALBUMIN 4.1 05/24/2020   CALCIUM 9.3 05/24/2020   ANIONGAP 9 09/24/2017   EGFR 85 05/24/2020   Lab Results  Component Value Date   CHOL 227 (H) 05/24/2020   Lab Results  Component Value Date   HDL 54 05/24/2020   Lab Results  Component Value Date   LDLCALC 159 (H) 05/24/2020   Lab Results  Component Value Date   TRIG 78 05/24/2020   Lab Results  Component Value Date   CHOLHDL 4.2 05/24/2020   Lab Results  Component Value Date   HGBA1C >14.0 (H) 05/24/2020

## 2020-08-26 ENCOUNTER — Encounter: Payer: BC Managed Care – PPO | Admitting: Family Medicine

## 2020-09-23 ENCOUNTER — Encounter: Payer: Self-pay | Admitting: Family Medicine

## 2020-09-29 ENCOUNTER — Encounter: Payer: Self-pay | Admitting: Family Medicine

## 2021-03-04 ENCOUNTER — Telehealth: Payer: Self-pay | Admitting: Family Medicine

## 2021-03-04 NOTE — Telephone Encounter (Signed)
Called to schedule follow up for diabetes

## 2021-03-06 DIAGNOSIS — R911 Solitary pulmonary nodule: Secondary | ICD-10-CM

## 2021-03-06 DIAGNOSIS — R590 Localized enlarged lymph nodes: Secondary | ICD-10-CM

## 2021-03-06 HISTORY — DX: Localized enlarged lymph nodes: R59.0

## 2021-03-06 HISTORY — DX: Solitary pulmonary nodule: R91.1

## 2021-03-09 ENCOUNTER — Ambulatory Visit: Payer: BC Managed Care – PPO | Admitting: Family Medicine

## 2021-03-09 ENCOUNTER — Encounter: Payer: Self-pay | Admitting: Family Medicine

## 2021-03-09 VITALS — BP 113/69 | HR 83 | Temp 97.1°F | Ht 68.0 in | Wt 185.0 lb

## 2021-03-09 DIAGNOSIS — R590 Localized enlarged lymph nodes: Secondary | ICD-10-CM

## 2021-03-09 DIAGNOSIS — E1165 Type 2 diabetes mellitus with hyperglycemia: Secondary | ICD-10-CM

## 2021-03-09 DIAGNOSIS — R911 Solitary pulmonary nodule: Secondary | ICD-10-CM

## 2021-03-09 DIAGNOSIS — I700 Atherosclerosis of aorta: Secondary | ICD-10-CM

## 2021-03-09 DIAGNOSIS — Z0000 Encounter for general adult medical examination without abnormal findings: Secondary | ICD-10-CM

## 2021-03-09 LAB — BAYER DCA HB A1C WAIVED: HB A1C (BAYER DCA - WAIVED): 12.2 % — ABNORMAL HIGH (ref 4.8–5.6)

## 2021-03-09 MED ORDER — OZEMPIC (0.25 OR 0.5 MG/DOSE) 2 MG/1.5ML ~~LOC~~ SOPN: 0.2500 mg | PEN_INJECTOR | SUBCUTANEOUS | 0 refills | Status: DC

## 2021-03-09 MED ORDER — FREESTYLE LIBRE 3 SENSOR MISC: 1.0000 | 2 refills | Status: AC

## 2021-03-09 MED ORDER — ASPIRIN 81 MG PO TBEC: 81.0000 mg | DELAYED_RELEASE_TABLET | Freq: Every day | ORAL | 12 refills | Status: AC

## 2021-03-09 MED ORDER — INSULIN GLARGINE-YFGN 100 UNIT/ML ~~LOC~~ SOPN: 15.0000 [IU] | PEN_INJECTOR | Freq: Every day | SUBCUTANEOUS | 0 refills | Status: DC

## 2021-03-09 MED ORDER — ATORVASTATIN CALCIUM 10 MG PO TABS: 10.0000 mg | ORAL_TABLET | Freq: Every day | ORAL | 2 refills | Status: DC

## 2021-03-09 NOTE — Progress Notes (Signed)
Assessment & Plan:  1. Type 2 diabetes mellitus with hyperglycemia, without long-term current use of insulin (HCC) Lab Results  Component Value Date   HGBA1C 12.2 (H) 03/09/2021   HGBA1C >14.0 (H) 05/24/2020    - Diabetes is not at goal of A1c < 7. - Medications:  continue recently restarted Semglee 15 units daily. Start Ozempic 0.25 mg weekly. - Home glucose monitoring: freestyle libre ordered - continue monitoring. - Patient is currently taking a statin. Patient is not taking an ACE-inhibitor/ARB.  - Instruction/counseling given: reminded to get eye exam  Diabetes Health Maintenance Due  Topic Date Due   OPHTHALMOLOGY EXAM  Never done   FOOT EXAM  06/11/2021   HEMOGLOBIN A1C  09/06/2021   URINE MICROALBUMIN  03/09/2022    Lab Results  Component Value Date   LABMICR 18.0 03/09/2021   - Lipid panel - Microalbumin / creatinine urine ratio - Bayer DCA Hb A1c Waived - insulin glargine-yfgn (SEMGLEE) 100 UNIT/ML Pen; Inject 15 Units into the skin daily.  Dispense: 15 mL; Refill: 0 - Semaglutide,0.25 or 0.5MG/DOS, (OZEMPIC, 0.25 OR 0.5 MG/DOSE,) 2 MG/1.5ML SOPN; Inject 0.25 mg into the skin once a week.  Dispense: 1.5 mL; Refill: 0 - atorvastatin (LIPITOR) 10 MG tablet; Take 1 tablet (10 mg total) by mouth daily.  Dispense: 30 tablet; Refill: 2 - Continuous Blood Gluc Sensor (FREESTYLE LIBRE 3 SENSOR) MISC; 1 Device by Does not apply route every 14 (fourteen) days. Place 1 sensor on the skin every 14 days. Use to check glucose continuously  Dispense: 2 each; Refill: 2  2. Aortic atherosclerosis ([place]) New diagnoses. Started ASA and Atorvastatin. - aspirin 81 MG EC tablet; Take 1 tablet (81 mg total) by mouth daily. Swallow whole.  Dispense: 30 tablet; Refill: 12 - atorvastatin (LIPITOR) 10 MG tablet; Take 1 tablet (10 mg total) by mouth daily.  Dispense: 30 tablet; Refill: 2  3. Mediastinal lymphadenopathy - Ambulatory referral to Pulmonology  4. Pulmonary nodule - Ambulatory  referral to Pulmonology  5. Healthcare maintenance Patient to complete mammogram on the bus that comes to our office.   Return in about 4 weeks (around 04/06/2021) for follow-up of chronic medication conditions.  Hendricks Limes, MSN, APRN, FNP-C [facility]  Subjective:    Patient ID: Catherine Chapman, female    DOB: 1960/08/13, 61 y.o.   MRN: [id number]  Patient Care Team: Loman Brooklyn, FNP as PCP - General (Family Medicine) Harlen Labs, MD as Referring Physician (Optometry)   Chief Complaint:  Chief Complaint  Patient presents with   Medical Management of Chronic Issues    HPI: Catherine Chapman is a 61 y.o. female presenting on 03/09/2021 for Medical Management of Chronic Issues  Diabetes: Patient presents for follow up of diabetes. Current symptoms include: hyperglycemia. Known diabetic complications: cardiovascular disease. Medication compliance: no, but did recently start using Semglee 15 units daily after she went to the hospital for chest pain. Current diet: in general, an "unhealthy" diet. Current exercise: none. Home blood sugar records:  150-200 when using insulin . Is she  on ACE inhibitor or angiotensin II receptor blocker? No. Is she on a statin? No.   New complaints: Patient was seen at [facility] on 03/06/2021 due to chest pain. It was determined chest pain was noncardiac in origin. She did have a CT Chest that revealed the following:  1. Mediastinal and bilateral hilar lymphadenopathy. Some lymph nodes demonstrate punctate areas of dystrophic calcification. Given the  calcification, sarcoidosis would be a consideration although the degree of calcification is not as pronounced as typically seen. Imaging features are not characteristic for granulomatous disease. Metastatic disease or lymphoproliferative disorder cannot be excluded. Close follow-up recommended.  2. 6 mm mm superior segment right lower lobe pulmonary nodule. Given the associated  lymphadenopathy, attention on follow-up imaging recommended.  3. Areas of subpleural reticulation in the lung bases bilaterally  with mild cylindrical bronchiectasis. Imaging features suggest a component of underlying interstitial lung disease.  4. Tiny hiatal hernia.  5. Aortic Atherosclerosis (ICD10-I70.0).   She was supposed to be referred to pulmonology but has not heard anything from them.    Social history:  Relevant past medical, surgical, family and social history reviewed and updated as indicated. Interim medical history since our last visit reviewed.  Allergies and medications reviewed and updated.  DATA REVIEWED: CHART IN EPIC  ROS: Negative unless specifically indicated above in HPI.    Current Outpatient Medications:    atorvastatin (LIPITOR) 10 MG tablet, Take 1 tablet (10 mg total) by mouth daily., Disp: 30 tablet, Rfl: 2   empagliflozin (JARDIANCE) 10 MG TABS tablet, Take 1 tablet (10 mg total) by mouth daily before breakfast., Disp: 30 tablet, Rfl: 2   Insulin Pen Needle 32G X 4 MM MISC, Use with insulin daily Dx E11.69, Disp: 100 each, Rfl: 3   Allergies  Allergen Reactions   Tramadol Nausea And Vomiting   Past Medical History:  Diagnosis Date   Back pain    Diabetes ([place])     Past Surgical History:  Procedure Laterality Date   I & D EXTREMITY Left 01/17/2017   Procedure: IRRIGATION AND DEBRIBEMENT WITH OPEN REDUCTION INTERNAL FIXATION LEFT INDEX FINGER WITH EXTENSOR TENDON REPAIR;  Surgeon: Charlotte Crumb, MD;  Location: [place];  Service: Orthopedics;  Laterality: Left;   KNEE SURGERY     KNEE SURGERY     ROTATOR CUFF REPAIR      Social History   Socioeconomic History   Marital status: Single    Spouse name: Not on file   Number of children: Not on file   Years of education: Not on file   Highest education level: Not on file  Occupational History   Not on file  Tobacco Use   Smoking status: Former    Types: Cigarettes    Quit date: 31     Years since quitting: 33.1   Smokeless tobacco: Never  Vaping Use   Vaping Use: Never used  Substance and Sexual Activity   Alcohol use: Yes    Comment: occ   Drug use: No   Sexual activity: Yes    Birth control/protection: None  Other Topics Concern   Not on file  Social History Narrative   Not on file   Social Determinants of Health   Financial Resource Strain: Not on file  Food Insecurity: Not on file  Transportation Needs: Not on file  Physical Activity: Not on file  Stress: Not on file  Social Connections: Not on file  Intimate Partner Violence: Not on file        Objective:    BP 113/69    Pulse 83    Temp (!) 97.1 F (36.2 C) (Temporal)    Ht '5\' 8"'  (1.727 m)    Wt 185 lb (83.9 kg)    SpO2 98%    BMI 28.13 kg/m   Wt Readings from Last 3 Encounters:  03/09/21 185 lb (83.9 kg)  06/11/20 178  lb 12.8 oz (81.1 kg)  04/22/20 179 lb (81.2 kg)    Physical Exam Vitals reviewed.  Constitutional:      General: She is not in acute distress.    Appearance: Normal appearance. She is overweight. She is not ill-appearing, toxic-appearing or diaphoretic.  HENT:     Head: Normocephalic and atraumatic.  Eyes:     General: No scleral icterus.       Right eye: No discharge.        Left eye: No discharge.     Conjunctiva/sclera: Conjunctivae normal.  Cardiovascular:     Rate and Rhythm: Normal rate and regular rhythm.     Heart sounds: Normal heart sounds. No murmur heard.   No friction rub. No gallop.  Pulmonary:     Effort: Pulmonary effort is normal. No respiratory distress.     Breath sounds: Normal breath sounds. No stridor. No wheezing, rhonchi or rales.  Musculoskeletal:        General: Normal range of motion.     Cervical back: Normal range of motion.  Skin:    General: Skin is warm and dry.     Capillary Refill: Capillary refill takes less than 2 seconds.  Neurological:     General: No focal deficit present.     Mental Status: She is alert and oriented to  person, place, and time. Mental status is at baseline.  Psychiatric:        Mood and Affect: Mood normal.        Behavior: Behavior normal.        Thought Content: Thought content normal.        Judgment: Judgment normal.    No results found for: TSH Lab Results  Component Value Date   WBC 8.1 05/24/2020   HGB 14.0 05/24/2020   HCT 42.6 05/24/2020   MCV 89 05/24/2020   PLT 301 05/24/2020   Lab Results  Component Value Date   NA 139 05/24/2020   K 4.4 05/24/2020   CO2 20 05/24/2020   GLUCOSE 151 (H) 05/24/2020   BUN 16 05/24/2020   CREATININE 0.80 05/24/2020   BILITOT 0.4 05/24/2020   ALKPHOS 98 05/24/2020   AST 22 05/24/2020   ALT 29 05/24/2020   PROT 7.7 05/24/2020   ALBUMIN 4.1 05/24/2020   CALCIUM 9.3 05/24/2020   ANIONGAP 9 09/24/2017   EGFR 85 05/24/2020   Lab Results  Component Value Date   CHOL 227 (H) 05/24/2020   Lab Results  Component Value Date   HDL 54 05/24/2020   Lab Results  Component Value Date   LDLCALC 159 (H) 05/24/2020   Lab Results  Component Value Date   TRIG 78 05/24/2020   Lab Results  Component Value Date   CHOLHDL 4.2 05/24/2020   Lab Results  Component Value Date   HGBA1C >14.0 (H) 05/24/2020

## 2021-03-09 NOTE — Patient Instructions (Signed)
Please schedule an eye exam

## 2021-03-10 LAB — LIPID PANEL
Chol/HDL Ratio: 3.5 ratio (ref 0.0–4.4)
Cholesterol, Total: 198 mg/dL (ref 100–199)
HDL: 56 mg/dL (ref >39)
LDL Chol Calc (NIH): 126 mg/dL — ABNORMAL HIGH (ref 0–99)
Triglycerides: 87 mg/dL (ref 0–149)
VLDL Cholesterol Cal: 16 mg/dL (ref 5–40)

## 2021-03-10 LAB — MICROALBUMIN / CREATININE URINE RATIO
Creatinine, Urine: 139.1 mg/dL
Microalb/Creat Ratio: 13 mg/g creat (ref 0–29)
Microalbumin, Urine: 18 ug/mL

## 2021-03-14 ENCOUNTER — Telehealth: Payer: Self-pay

## 2021-03-14 ENCOUNTER — Encounter: Payer: Self-pay | Admitting: Family Medicine

## 2021-03-14 DIAGNOSIS — R911 Solitary pulmonary nodule: Secondary | ICD-10-CM | POA: Insufficient documentation

## 2021-03-14 DIAGNOSIS — I700 Atherosclerosis of aorta: Secondary | ICD-10-CM | POA: Insufficient documentation

## 2021-03-14 DIAGNOSIS — R590 Localized enlarged lymph nodes: Secondary | ICD-10-CM | POA: Insufficient documentation

## 2021-03-14 DIAGNOSIS — E1165 Type 2 diabetes mellitus with hyperglycemia: Secondary | ICD-10-CM

## 2021-03-14 NOTE — Telephone Encounter (Signed)
Catherine Chapman (Key: [id number]) Rx #[id number] Ozempic (0.25 or 0.5 MG/DOSE) 2MG /1.5ML pen-injectors  Sent to plan

## 2021-03-16 NOTE — Telephone Encounter (Signed)
Message from Plan Your PA request has been denied. Additional information will be provided in the denial communication. (Message 1140)   Coverage for this medication is denied for the following reason(s). We reviewed the information we received about your condition and circumstances. We used the plan approved policy when making this decision. The policy states that this medication may be approved when: - The member has a clinical condition or needs a specific dosage form for which there is no alternative on the formulary OR - The listed formulary alternatives are not recommended based on published guidelines or clinical literature OR - The formulary alternatives will likely be ineffective or less effective for the member OR - The formulary alternatives will likely cause an adverse effect OR - The member is unable to take the required number of formulary alternatives for the given diagnosis due to a trial and inadequate treatment response or contraindication OR - The member has tried and failed the required number of formulary alternatives

## 2021-03-16 NOTE — Telephone Encounter (Signed)
What are her formulary alternatives?

## 2021-03-17 MED ORDER — TRULICITY 0.75 MG/0.5ML ~~LOC~~ SOAJ: 0.7500 mg | SUBCUTANEOUS | 2 refills | Status: DC

## 2021-03-17 NOTE — Addendum Note (Signed)
Addended by: Gwenlyn Fudge on: 03/17/2021 01:04 PM   Modules accepted: Orders

## 2021-03-17 NOTE — Telephone Encounter (Signed)
Formulary alternative(s) are Trulicity, Victoza. Requirement: 3 in a class with 3 or more alternatives, 2 in a class with 2 alternatives, or 1 in a class with only 1 alternative. Please refer to your plan documents for a complete list of alternatives

## 2021-03-17 NOTE — Telephone Encounter (Signed)
PT AWARE  

## 2021-03-17 NOTE — Telephone Encounter (Signed)
Thank you. Please let patient know her insurance did not cover Ozempic, but I have sent Trulicity which is in the same medication class. It is still once weekly.

## 2021-03-21 ENCOUNTER — Other Ambulatory Visit: Payer: Self-pay

## 2021-03-21 ENCOUNTER — Telehealth: Payer: Self-pay

## 2021-03-21 DIAGNOSIS — E1165 Type 2 diabetes mellitus with hyperglycemia: Secondary | ICD-10-CM

## 2021-03-21 MED ORDER — TRULICITY 0.75 MG/0.5ML ~~LOC~~ SOAJ: 0.7500 mg | SUBCUTANEOUS | 2 refills | Status: DC

## 2021-03-21 NOTE — Telephone Encounter (Signed)
Sent to plan. Awaiting response  Key: [id number]   - PA Case ID: [id number]   - Rx #: [id number] Need help? Call us at [phone]  Status Sent to Plan today Drug Trulicity 0.75MG /0.5ML pen-injectors Form Ambulance person PA Form [phone] [employer]) Original Claim Info [phone]

## 2021-03-22 NOTE — Telephone Encounter (Signed)
[facility], the utilization review entity for [employer] [facility], received a request for coverage of TRULICITY INJ 0.75/0.5 for you. Weve denied the request for the following reason(s): You do not meet the requirements of your plan. Your plan covers this drug when you meet any of these conditions: - You tried the alternate drug(s) while covered by the current or the previous health benefit plan - The alternate drug(s) caused or is reasonably expected to cause an adverse reaction for you - You tried the alternate drug(s) and it did not work for you Your request has been denied based on the information we have. An explanation of the full clinical review criteria used in our decision is included in this notice.

## 2021-03-22 NOTE — Telephone Encounter (Signed)
They did not give [place] a list of alternative drugs for this patient

## 2021-03-22 NOTE — Telephone Encounter (Signed)
What are the alternate drugs?

## 2021-03-23 MED ORDER — METFORMIN HCL ER 500 MG PO TB24: ORAL_TABLET | ORAL | 0 refills | Status: DC

## 2021-03-23 NOTE — Telephone Encounter (Signed)
I am assuming they want her to be on Metformin first. Please let her know I have sent in this prescription. Please make sure she understands the directions.  ?

## 2021-03-24 NOTE — Telephone Encounter (Signed)
Attempted to contact patient - NA °

## 2021-03-28 NOTE — Telephone Encounter (Signed)
lmtcb

## 2021-03-30 NOTE — Telephone Encounter (Signed)
Patient aware and states that she can not take metformin since it gives her bad headaches and yeast infections.  ?

## 2021-03-30 NOTE — Telephone Encounter (Signed)
Can we please try her PA again now that we know she is intolerant to metformin? I have added it to her list of allergies. ?

## 2021-03-30 NOTE — Telephone Encounter (Signed)
PT R/C 

## 2021-04-04 NOTE — Telephone Encounter (Signed)
Key: Gerre Pebbles - PA Case ID: [id number] ?Drug ?Trulicity 0.75MG /0.5ML pen-injectors ? ?Sent to plan ?

## 2021-04-06 ENCOUNTER — Ambulatory Visit: Payer: BC Managed Care – PPO | Admitting: Family Medicine

## 2021-04-06 NOTE — Telephone Encounter (Signed)
This was the only information given, there were not alternative list given ?

## 2021-04-06 NOTE — Telephone Encounter (Signed)
I need to know what these alternate drugs are.  ?

## 2021-04-06 NOTE — Telephone Encounter (Signed)
Can we please ask patient to call her insurance company and find out what the preferred medications for diabetes are since we cannot seem to get things approved? ?

## 2021-04-06 NOTE — Telephone Encounter (Signed)
Deniedon March 13 ?Your PA request has been denied. Additional information will be provided in the denial communication. (Message 1140 ? ?Information about coverage denials ?We based this coverage denial on the terms of your benefit plan. Your plan does not cover services that ?are not medically necessary or that are contractually excluded. For a full explanation of the coverage ?available, please review your Certificate of Coverage. The Pharmacy Benefit Exclusions section provides ?Details ? ?You do not meet the requirements of your plan. Your plan covers this drug when you meet any of these ?conditions: ?- You tried the alternate drug(s) while covered by the current or the previous health benefit plan ?- The alternate drug(s) caused or is reasonably expected to cause an adverse reaction for you ?- You tried the alternate drug(s) and it did not work for you ?Your request has been denied based on the information we have ?

## 2021-04-07 NOTE — Telephone Encounter (Signed)
Patient aware and verbalizes understanding. 

## 2021-04-19 ENCOUNTER — Encounter: Payer: Self-pay | Admitting: Family Medicine

## 2021-04-19 ENCOUNTER — Ambulatory Visit: Payer: BC Managed Care – PPO | Admitting: Family Medicine

## 2021-04-19 ENCOUNTER — Other Ambulatory Visit: Payer: Self-pay | Admitting: Family Medicine

## 2021-04-19 VITALS — BP 139/85 | HR 89 | Temp 96.3°F | Ht 68.0 in | Wt 191.4 lb

## 2021-04-19 DIAGNOSIS — E1165 Type 2 diabetes mellitus with hyperglycemia: Secondary | ICD-10-CM | POA: Diagnosis not present

## 2021-04-19 DIAGNOSIS — R911 Solitary pulmonary nodule: Secondary | ICD-10-CM

## 2021-04-19 DIAGNOSIS — R590 Localized enlarged lymph nodes: Secondary | ICD-10-CM | POA: Diagnosis not present

## 2021-04-19 DIAGNOSIS — Z0000 Encounter for general adult medical examination without abnormal findings: Secondary | ICD-10-CM

## 2021-04-19 DIAGNOSIS — Z1231 Encounter for screening mammogram for malignant neoplasm of breast: Secondary | ICD-10-CM

## 2021-04-19 DIAGNOSIS — I700 Atherosclerosis of aorta: Secondary | ICD-10-CM | POA: Diagnosis not present

## 2021-04-19 MED ORDER — EMPAGLIFLOZIN 10 MG PO TABS: 10.0000 mg | ORAL_TABLET | Freq: Every day | ORAL | 2 refills | Status: DC

## 2021-04-19 NOTE — Progress Notes (Signed)
? ?Assessment & Plan:  ?1. Type 2 diabetes mellitus with hyperglycemia, without long-term current use of insulin (HCC) ?Lab Results  ?Component Value Date  ? HGBA1C 12.2 (H) 03/09/2021  ? HGBA1C >14.0 (H) 05/24/2020  ?  ?- Diabetes is not at goal of A1c < 7, but is improving based on home glucose readings. ?- Medications: continue current medications; start Jardiance 10 mg daily. ?- Home glucose monitoring: Encouraged to use freestyle libre. ?- Patient is currently taking a statin. Patient is not taking an ACE-inhibitor/ARB.  ?- Instruction/counseling given: reminded to get eye exam and discussed diet ? ?Diabetes Health Maintenance Due  ?Topic Date Due  ? OPHTHALMOLOGY EXAM  Never done  ? FOOT EXAM  06/11/2021  ? HEMOGLOBIN A1C  09/06/2021  ? URINE MICROALBUMIN  03/09/2022  ?  ?Lab Results  ?Component Value Date  ? LABMICR 18.0 03/09/2021  ? ?- insulin glargine (LANTUS) 100 UNIT/ML injection; Inject 15 Units into the skin daily. ?- empagliflozin (JARDIANCE) 10 MG TABS tablet; Take 1 tablet (10 mg total) by mouth daily before breakfast.  Dispense: 30 tablet; Refill: 2 ? ?2. Aortic atherosclerosis (HCC) ?Continue statin and aspirin. ? ?3-4. Mediastinal lymphadenopathy/Pulmonary nodule ?Phone # provided for patient to call pulmonology office and schedule her appointment.  ?- Ambulatory referral to Pulmonology ? ?5. Healthcare maintenance ?Patient to schedule diabetic eye exam and mammogram on the bus that comes to our office. She will return next month for her physical with a pap smear (she needs this completed before the end of April for work).  ? ? ?Return in April, for annual physical w. pap. ? ?Deliah Boston, MSN, APRN, FNP-C ?[facility] ? ?Subjective:  ? ? Patient ID: Catherine Chapman, female    DOB: Jul 06, 1960, 61 y.o.   MRN: [id number] ? ?Patient Care Team: ?Gwenlyn Fudge, FNP as PCP - General (Family Medicine) ?Michaelle Copas, MD as Referring Physician (Optometry)  ? ?Chief Complaint:   ?Chief Complaint  ?Patient presents with  ? Medical Management of Chronic Issues  ?  4 week chronic follow up   ? ? ?HPI: ?Catherine Chapman is a 61 y.o. female presenting on 04/19/2021 for Medical Management of Chronic Issues (4 week chronic follow up ) ? ?Diabetes: Patient presents for follow up of diabetes. Current symptoms include: hyperglycemia. Known diabetic complications: cardiovascular disease. Medication compliance: yes, Lantus 15 units daily. We have been unable to get insurance to approve Ozempic or Trulicity. Previously intolerant to Metformin. Current diet: in general, an "unhealthy" diet. Current exercise: none. Home blood sugar records: BGs consistently in an acceptable range, fasting ranges 98-103 . She was prescribed a freestyle libre which she did start using, but she states it doesn't stay on well due to the amount she sweats at work. Is she  on ACE inhibitor or angiotensin II receptor blocker? No. Is she on a statin? Yes (Atorvastatin).  ? ?Aortic Atherosclerosis: diagnosed in February 2023. Taking aspirin and Atorvastatin. ? ?Mediastinal Lymphadenopathy/Pulmonary Nodule: noted on imaging at [facility] in February 2023. She has been referred to pulmonology, but states she has not heard from them.  ? ?New complaints: ?None ? ? ?Social history: ? ?Relevant past medical, surgical, family and social history reviewed and updated as indicated. Interim medical history since our last visit reviewed. ? ?Allergies and medications reviewed and updated. ? ?DATA REVIEWED: CHART IN EPIC ? ?ROS: Negative unless specifically indicated above in HPI.  ? ? ?Current Outpatient Medications:  ?  aspirin  81 MG EC tablet, Take 1 tablet (81 mg total) by mouth daily. Swallow whole., Disp: 30 tablet, Rfl: 12 ?  atorvastatin (LIPITOR) 10 MG tablet, Take 1 tablet (10 mg total) by mouth daily., Disp: 30 tablet, Rfl: 2 ?  Continuous Blood Gluc Sensor (FREESTYLE LIBRE 3 SENSOR) MISC, 1 Device by Does not apply route every 14  (fourteen) days. Place 1 sensor on the skin every 14 days. Use to check glucose continuously, Disp: 2 each, Rfl: 2 ?  insulin glargine (LANTUS) 100 UNIT/ML injection, Inject 15 Units into the skin daily., Disp: , Rfl:  ?  insulin glargine-yfgn (SEMGLEE) 100 UNIT/ML Pen, Inject 15 Units into the skin daily., Disp: 15 mL, Rfl: 0 ?  Insulin Pen Needle 32G X 4 MM MISC, Use with insulin daily Dx E11.69, Disp: 100 each, Rfl: 3 ?  Dulaglutide (TRULICITY) 0.75 MG/0.5ML SOPN, Inject 0.75 mg into the skin once a week. (Patient not taking: Reported on 04/19/2021), Disp: 2 mL, Rfl: 2  ? ?Allergies  ?Allergen Reactions  ? Tramadol Nausea And Vomiting  ? Metformin And Related Other (See Comments)  ?  Headaches  ? ?Past Medical History:  ?Diagnosis Date  ? Aortic atherosclerosis (HCC)   ? Back pain   ? Diabetes (HCC)   ? Mediastinal lymphadenopathy 03/06/2021  ? [facility]  ? Pulmonary nodule 03/06/2021  ? [facility]  ?  ?Past Surgical History:  ?Procedure Laterality Date  ? I & D EXTREMITY Left 01/17/2017  ? Procedure: IRRIGATION AND DEBRIBEMENT WITH OPEN REDUCTION INTERNAL FIXATION LEFT INDEX FINGER WITH EXTENSOR TENDON REPAIR;  Surgeon: Dairl PonderWeingold, Matthew, MD;  Location: [facility] OR;  Service: Orthopedics;  Laterality: Left;  ? KNEE SURGERY    ? KNEE SURGERY    ? ROTATOR CUFF REPAIR    ?  ?Social History  ? ?Socioeconomic History  ? Marital status: Single  ?  Spouse name: Not on file  ? Number of children: Not on file  ? Years of education: Not on file  ? Highest education level: Not on file  ?Occupational History  ? Not on file  ?Tobacco Use  ? Smoking status: Former  ?  Types: Cigarettes  ?  Quit date: 821990  ?  Years since quitting: 33.2  ? Smokeless tobacco: Never  ?Vaping Use  ? Vaping Use: Never used  ?Substance and Sexual Activity  ? Alcohol use: Yes  ?  Comment: occ  ? Drug use: No  ? Sexual activity: Yes  ?  Birth control/protection: None  ?Other Topics Concern  ? Not on file  ?Social History  Narrative  ? Not on file  ? ?Social Determinants of Health  ? ?Financial Resource Strain: Not on file  ?Food Insecurity: Not on file  ?Transportation Needs: Not on file  ?Physical Activity: Not on file  ?Stress: Not on file  ?Social Connections: Not on file  ?Intimate Partner Violence: Not on file  ?  ? ?   ?Objective:  ?  ?BP 139/85   Pulse 89   Temp (!) 96.3 ?F (35.7 ?C) (Temporal)   Ht 5\' 8"  (1.727 m)   Wt 191 lb 6.4 oz (86.8 kg)   SpO2 96%   BMI 29.10 kg/m?  ? ?Wt Readings from Last 3 Encounters:  ?04/19/21 191 lb 6.4 oz (86.8 kg)  ?03/09/21 185 lb (83.9 kg)  ?06/11/20 178 lb 12.8 oz (81.1 kg)  ? ? ?Physical Exam ?Vitals reviewed.  ?Constitutional:   ?  General: She is not in acute distress. ?   Appearance: Normal appearance. She is overweight. She is not ill-appearing, toxic-appearing or diaphoretic.  ?HENT:  ?   Head: Normocephalic and atraumatic.  ?Eyes:  ?   General: No scleral icterus.    ?   Right eye: No discharge.     ?   Left eye: No discharge.  ?   Conjunctiva/sclera: Conjunctivae normal.  ?Cardiovascular:  ?   Rate and Rhythm: Normal rate and regular rhythm.  ?   Heart sounds: Normal heart sounds. No murmur heard. ?  No friction rub. No gallop.  ?Pulmonary:  ?   Effort: Pulmonary effort is normal. No respiratory distress.  ?   Breath sounds: Normal breath sounds. No stridor. No wheezing, rhonchi or rales.  ?Musculoskeletal:     ?   General: Normal range of motion.  ?   Cervical back: Normal range of motion.  ?Skin: ?   General: Skin is warm and dry.  ?   Capillary Refill: Capillary refill takes less than 2 seconds.  ?Neurological:  ?   General: No focal deficit present.  ?   Mental Status: She is alert and oriented to person, place, and time. Mental status is at baseline.  ?Psychiatric:     ?   Mood and Affect: Mood normal.     ?   Behavior: Behavior normal.     ?   Thought Content: Thought content normal.     ?   Judgment: Judgment normal.  ? ? ?No results found for: TSH ?Lab Results   ?Component Value Date  ? WBC 8.1 05/24/2020  ? HGB 14.0 05/24/2020  ? HCT 42.6 05/24/2020  ? MCV 89 05/24/2020  ? PLT 301 05/24/2020  ? ?Lab Results  ?Component Value Date  ? NA 139 05/24/2020  ? K 4.4 05/24/2020  ? CO2

## 2021-04-19 NOTE — Patient Instructions (Addendum)
[facility] ?[address] - [place] [address] ?[phone] ? ?Please schedule your eye exam. ?

## 2021-04-24 ENCOUNTER — Encounter: Payer: Self-pay | Admitting: Family Medicine

## 2021-04-27 ENCOUNTER — Telehealth: Payer: Self-pay | Admitting: *Deleted

## 2021-04-27 NOTE — Telephone Encounter (Signed)
PA for Jardiance-In Process ? ? ?Key: [id number] ? ?PA Case ID: [id number] ? ?Your information has been submitted to [employer]. To check for an updated outcome later, reopen this PA request from your dashboard. ? ?If [employer] has not responded to your request within 24 hours, contact [employer] at [phone]. If you think there may be a problem with your PA request, use our live chat feature at the bottom right. ?

## 2021-05-02 ENCOUNTER — Telehealth: Payer: Self-pay

## 2021-05-02 NOTE — Telephone Encounter (Signed)
Catherine Chapman Key: [id number] - PA Case ID: [id number] - Rx #: [id number] Need help? Call us at [phone] ?Outcome ?Approvedon April 6 ?Your PA request has been approved. Additional information will be provided in the approval communication. (Message 1145) ?Drug ?Jardiance 10MG  tablets ?Form ?[employer] Electronic PA Form [phone] NCPDP) ?Original Claim Info ?7 ?

## 2021-05-03 NOTE — Telephone Encounter (Signed)
Approvedon April 6 ?Your PA request has been approved. Additional information will be provided in the approval communication. (Message 1145) ? ?Pharmacy aware.  ?

## 2021-05-12 ENCOUNTER — Telehealth: Payer: Self-pay | Admitting: Pharmacist

## 2021-05-12 ENCOUNTER — Encounter: Payer: Self-pay | Admitting: Family Medicine

## 2021-05-12 ENCOUNTER — Other Ambulatory Visit (HOSPITAL_COMMUNITY)
Admission: RE | Admit: 2021-05-12 | Discharge: 2021-05-12 | Disposition: A | Discharge status: Home / Self Care | Payer: BC Managed Care – PPO | Source: Ambulatory Visit | Attending: Family Medicine | Admitting: Family Medicine

## 2021-05-12 ENCOUNTER — Ambulatory Visit (INDEPENDENT_AMBULATORY_CARE_PROVIDER_SITE_OTHER): Payer: BC Managed Care – PPO | Admitting: Family Medicine

## 2021-05-12 VITALS — BP 130/74 | HR 83 | Temp 96.4°F | Ht 68.0 in | Wt 185.2 lb

## 2021-05-12 DIAGNOSIS — Z113 Encounter for screening for infections with a predominantly sexual mode of transmission: Secondary | ICD-10-CM

## 2021-05-12 DIAGNOSIS — Z0000 Encounter for general adult medical examination without abnormal findings: Secondary | ICD-10-CM

## 2021-05-12 DIAGNOSIS — Z1151 Encounter for screening for human papillomavirus (HPV): Secondary | ICD-10-CM | POA: Insufficient documentation

## 2021-05-12 DIAGNOSIS — Z124 Encounter for screening for malignant neoplasm of cervix: Secondary | ICD-10-CM | POA: Diagnosis present

## 2021-05-12 DIAGNOSIS — E1165 Type 2 diabetes mellitus with hyperglycemia: Secondary | ICD-10-CM

## 2021-05-12 MED ORDER — TRULICITY 0.75 MG/0.5ML ~~LOC~~ SOAJ: 0.7500 mg | SUBCUTANEOUS | 2 refills | Status: DC

## 2021-05-12 NOTE — Progress Notes (Signed)
? ?Assessment & Plan:  ?1. Well adult exam ?Preventive health education provided. ? ?2. Screening for cervical cancer ?- Cytology - PAP ? ?3. Routine screening for STI (sexually transmitted infection) ?- Cytology - PAP ? ?4. Screening for human papillomavirus (HPV) ?- Cytology - PAP ? ? ?Follow-up: Return in about 6 weeks (around 06/23/2021) for DM.  ? ?Hendricks Limes, MSN, APRN, FNP-C ?[facility] ? ?Subjective:  ?Patient ID: Catherine Chapman, female    DOB: Jun 17, 1960  Age: 61 y.o. MRN: [id number] ? ?Patient Care Team: ?Loman Brooklyn, FNP as PCP - General (Family Medicine) ?Harlen Labs, MD as Referring Physician (Optometry)  ? ?CC:  ?Chief Complaint  ?Patient presents with  ? Gynecologic Exam  ? ? ?HPI ?Catherine Chapman presents for her annual physical. ? ?Occupation: [employer], Marital status: Single, Substance use: None ?Diet: regular, Exercise: none ?Last eye exam: unknown; scheduled for next month ?Last dental exam: unknown ?Last colonoscopy: Never; declined colonoscopy and Cologuard ?Last mammogram: 2014; scheduled for 05/16/2021 ?Last pap smear: Unknown; will complete today ?Hepatitis C Screening: Declined ?Immunizations: Flu Vaccine:  Not influenza season ?Tdap Vaccine: up to date  ?Shingrix Vaccine: declined  ?COVID-19 Vaccine: declined ?Pneumonia Vaccine: declined ? ?DEPRESSION SCREENING ? ?  04/19/2021  ? 10:19 AM 03/09/2021  ? 12:14 PM 06/11/2020  ?  8:53 AM 04/22/2020  ? 11:12 AM  ?PHQ 2/9 Scores  ?PHQ - 2 Score 0 0 0 0  ?PHQ- 9 Score 0 2 0   ?  ? ?Review of Systems  ?Constitutional:  Negative for chills, fever, malaise/fatigue and weight loss.  ?HENT:  Negative for congestion, ear discharge, ear pain, nosebleeds, sinus pain, sore throat and tinnitus.   ?Eyes:  Negative for blurred vision, double vision, pain, discharge and redness.  ?Respiratory:  Negative for cough, shortness of breath and wheezing.   ?Cardiovascular:  Negative for chest pain, palpitations and leg swelling.   ?Gastrointestinal:  Negative for abdominal pain, constipation, diarrhea, heartburn, nausea and vomiting.  ?Genitourinary:  Negative for dysuria, frequency and urgency.  ?Musculoskeletal:  Negative for myalgias.  ?Skin:  Negative for rash.  ?Neurological:  Negative for dizziness, seizures, weakness and headaches.  ?Psychiatric/Behavioral:  Negative for depression, substance abuse and suicidal ideas. The patient is not nervous/anxious.   ? ? ?Current Outpatient Medications:  ?  aspirin 81 MG EC tablet, Take 1 tablet (81 mg total) by mouth daily. Swallow whole., Disp: 30 tablet, Rfl: 12 ?  atorvastatin (LIPITOR) 10 MG tablet, Take 1 tablet (10 mg total) by mouth daily., Disp: 30 tablet, Rfl: 2 ?  Continuous Blood Gluc Sensor (FREESTYLE LIBRE 3 SENSOR) MISC, 1 Device by Does not apply route every 14 (fourteen) days. Place 1 sensor on the skin every 14 days. Use to check glucose continuously, Disp: 2 each, Rfl: 2 ?  insulin glargine (LANTUS) 100 UNIT/ML injection, Inject 15 Units into the skin daily., Disp: , Rfl:  ?  Insulin Pen Needle 32G X 4 MM MISC, Use with insulin daily Dx E11.69, Disp: 100 each, Rfl: 3 ?  empagliflozin (JARDIANCE) 10 MG TABS tablet, Take 1 tablet (10 mg total) by mouth daily before breakfast. (Patient not taking: Reported on 05/12/2021), Disp: 30 tablet, Rfl: 2 ? ?Allergies  ?Allergen Reactions  ? Tramadol Nausea And Vomiting  ? Metformin And Related Other (See Comments)  ?  Headaches  ? ? ?Past Medical History:  ?Diagnosis Date  ? Aortic atherosclerosis ([place])   ? Back pain   ?  Diabetes ([place])   ? Mediastinal lymphadenopathy 03/06/2021  ? [facility]  ? Pulmonary nodule 03/06/2021  ? Chest CT at [facility]  ? ? ?Past Surgical History:  ?Procedure Laterality Date  ? I & D EXTREMITY Left 01/17/2017  ? Procedure: IRRIGATION AND DEBRIBEMENT WITH OPEN REDUCTION INTERNAL FIXATION LEFT INDEX FINGER WITH EXTENSOR TENDON REPAIR;  Surgeon: Charlotte Crumb, MD;  Location: [place];  Service:  Orthopedics;  Laterality: Left;  ? KNEE SURGERY    ? KNEE SURGERY    ? ROTATOR CUFF REPAIR    ? ? ?Family History  ?Problem Relation Age of Onset  ? Diabetes Mother   ? Stroke Mother   ? Diabetes Father   ? Stroke Father   ? Diabetes Sister   ? Lupus Daughter   ? Diabetes Maternal Grandmother   ? Stomach cancer Maternal Grandfather   ? Hypertension Paternal Grandmother   ? Diabetes Sister   ? ? ?Social History  ? ?Socioeconomic History  ? Marital status: Single  ?  Spouse name: Not on file  ? Number of children: Not on file  ? Years of education: Not on file  ? Highest education level: Not on file  ?Occupational History  ? Not on file  ?Tobacco Use  ? Smoking status: Former  ?  Types: Cigarettes  ?  Quit date: 5  ?  Years since quitting: 33.3  ? Smokeless tobacco: Never  ?Vaping Use  ? Vaping Use: Never used  ?Substance and Sexual Activity  ? Alcohol use: Yes  ?  Comment: occ  ? Drug use: No  ? Sexual activity: Yes  ?  Birth control/protection: None  ?Other Topics Concern  ? Not on file  ?Social History Narrative  ? Not on file  ? ?Social Determinants of Health  ? ?Financial Resource Strain: Not on file  ?Food Insecurity: Not on file  ?Transportation Needs: Not on file  ?Physical Activity: Not on file  ?Stress: Not on file  ?Social Connections: Not on file  ?Intimate Partner Violence: Not on file  ? ? ?  ?Objective:  ?  ?BP 130/74   Pulse 83   Temp (!) 96.4 ?F (35.8 ?C) (Temporal)   Ht 5\' 8"  (1.727 m)   Wt 185 lb 3.2 oz (84 kg)   SpO2 97%   BMI 28.16 kg/m?  ? ?Wt Readings from Last 3 Encounters:  ?05/12/21 185 lb 3.2 oz (84 kg)  ?04/19/21 191 lb 6.4 oz (86.8 kg)  ?03/09/21 185 lb (83.9 kg)  ? ?Physical Exam ?Vitals reviewed.  ?Constitutional:   ?   General: She is not in acute distress. ?   Appearance: Normal appearance. She is overweight. She is not ill-appearing, toxic-appearing or diaphoretic.  ?HENT:  ?   Head: Normocephalic and atraumatic.  ?   Right Ear: Tympanic membrane, ear canal and external ear  normal. There is no impacted cerumen.  ?   Left Ear: Tympanic membrane, ear canal and external ear normal. There is no impacted cerumen.  ?   Nose: Nose normal. No congestion or rhinorrhea.  ?   Mouth/Throat:  ?   Mouth: Mucous membranes are moist.  ?   Pharynx: Oropharynx is clear. No oropharyngeal exudate or posterior oropharyngeal erythema.  ?Eyes:  ?   General: No scleral icterus.    ?   Right eye: No discharge.     ?   Left eye: No discharge.  ?   Conjunctiva/sclera: Conjunctivae normal.  ?  Pupils: Pupils are equal, round, and reactive to light.  ?Cardiovascular:  ?   Rate and Rhythm: Normal rate and regular rhythm.  ?   Heart sounds: Normal heart sounds. No murmur heard. ?  No friction rub. No gallop.  ?Pulmonary:  ?   Effort: Pulmonary effort is normal. No respiratory distress.  ?   Breath sounds: Normal breath sounds. No stridor. No wheezing, rhonchi or rales.  ?Abdominal:  ?   General: Abdomen is flat. Bowel sounds are normal. There is no distension.  ?   Palpations: Abdomen is soft. There is no hepatomegaly, splenomegaly or mass.  ?   Tenderness: There is no abdominal tenderness. There is no guarding or rebound.  ?   Hernia: No hernia is present.  ?Musculoskeletal:     ?   General: Normal range of motion.  ?   Cervical back: Normal range of motion and neck supple. No rigidity. No muscular tenderness.  ?Lymphadenopathy:  ?   Cervical: No cervical adenopathy.  ?Skin: ?   General: Skin is warm and dry.  ?   Capillary Refill: Capillary refill takes less than 2 seconds.  ?Neurological:  ?   General: No focal deficit present.  ?   Mental Status: She is alert and oriented to person, place, and time. Mental status is at baseline.  ?Psychiatric:     ?   Mood and Affect: Mood normal.     ?   Behavior: Behavior normal.     ?   Thought Content: Thought content normal.     ?   Judgment: Judgment normal.  ? ? ?No results found for: TSH ?Lab Results  ?Component Value Date  ? WBC 8.1 05/24/2020  ? HGB 14.0 05/24/2020  ?  HCT 42.6 05/24/2020  ? MCV 89 05/24/2020  ? PLT 301 05/24/2020  ? ?Lab Results  ?Component Value Date  ? NA 139 05/24/2020  ? K 4.4 05/24/2020  ? CO2 20 05/24/2020  ? GLUCOSE 151 (H) 05/24/2020  ? BUN 16 05/24/2020  ?

## 2021-05-12 NOTE — Telephone Encounter (Signed)
New start trulicity ?Will complete PA ?

## 2021-05-13 ENCOUNTER — Telehealth: Payer: Self-pay

## 2021-05-13 ENCOUNTER — Telehealth: Payer: Self-pay | Admitting: *Deleted

## 2021-05-13 NOTE — Telephone Encounter (Signed)
Catherine Chapman ?Key: [id number] ?PA Case ID: [id number] ? ?Outcome: Approved ? ?Your PA request has been approved. ? ?Drug: Trulicity 0.75MG /0.5ML pen-injectors ?

## 2021-05-13 NOTE — Telephone Encounter (Signed)
Error

## 2021-05-16 ENCOUNTER — Ambulatory Visit
Admission: RE | Admit: 2021-05-16 | Discharge: 2021-05-16 | Disposition: A | Discharge status: Home / Self Care | Payer: Self-pay | Source: Ambulatory Visit | Attending: Family Medicine | Admitting: Family Medicine

## 2021-05-16 DIAGNOSIS — Z1231 Encounter for screening mammogram for malignant neoplasm of breast: Secondary | ICD-10-CM

## 2021-05-19 LAB — CYTOLOGY - PAP
Chlamydia: NEGATIVE
Comment: NEGATIVE
Comment: NEGATIVE
Comment: NEGATIVE
Comment: NORMAL
High risk HPV: POSITIVE — AB
Neisseria Gonorrhea: NEGATIVE
Trichomonas: NEGATIVE

## 2021-05-24 ENCOUNTER — Encounter: Payer: Self-pay | Admitting: Emergency Medicine

## 2021-06-02 ENCOUNTER — Institutional Professional Consult (permissible substitution): Payer: Self-pay | Admitting: Internal Medicine

## 2021-08-03 ENCOUNTER — Encounter: Payer: Self-pay | Admitting: Pulmonary Disease

## 2021-08-03 ENCOUNTER — Ambulatory Visit (INDEPENDENT_AMBULATORY_CARE_PROVIDER_SITE_OTHER): Payer: BC Managed Care – PPO | Admitting: Pulmonary Disease

## 2021-08-03 VITALS — BP 122/68 | HR 74 | Temp 98.4°F | Ht 68.0 in | Wt 189.0 lb

## 2021-08-03 DIAGNOSIS — J849 Interstitial pulmonary disease, unspecified: Secondary | ICD-10-CM | POA: Diagnosis not present

## 2021-08-03 DIAGNOSIS — R590 Localized enlarged lymph nodes: Secondary | ICD-10-CM | POA: Diagnosis not present

## 2021-08-03 NOTE — Patient Instructions (Signed)
Lab tests today  Will schedule CT chest with IV contrast at [facility]  Will schedule pulmonary function test in [place] office and follow up after that in 6 to 8 weeks

## 2021-08-03 NOTE — Progress Notes (Signed)
[facility]  Chief Complaint  Patient presents with   Consult    Ref by Dr. Alona Bene for CT results- abnormal CT     Past Surgical History:  She  has a past surgical history that includes Knee surgery; Knee surgery; Rotator cuff repair; and I & D extremity (Left, 01/17/2017).  Past Medical History:  DM type 2  Constitutional:  BP 122/68 (BP Location: Left Arm, Patient Position: Sitting)   Pulse 74   Temp 98.4 F (36.9 C) (Temporal)   Ht 5\' 8"  (1.727 m)   Wt 189 lb (85.7 kg)   SpO2 98% Comment: ra  BMI 28.74 kg/m   Brief Summary:  Catherine Chapman is a 61 y.o. female with abnormal CT chest.      Subjective:   She is from [place], [place].  [place] an [place] until about 1.5 years ago.  She worked previously in Engineer, production and had possible asbestos exposure several years ago while working with [employer] representative.  Her grandfather worked around asbestos and she would live with him at times when growing up.  More recently she has been working in a [employer] in [place].  She says she is always covered in fibers at work.  She isn't required to wear a mask, but she does.  She had walking pneumonia several years ago.  No history of tuberculosis.  She has a [place].  She never smoked cigarettes.  Her father had COPD.  She was temporarily shifted to a new work area in February.  She had more yarn exposure that usually.  She was seen in the ER in February 2023 after developing chest pain and shortness of breath at work.  This is the only time this happened.  Her CT chest showed mild mediastinal adenopathy with calcifications, several small lung nodules, mild changes of ILD and bronchiectasis.  She doesn't feel like her breathing limits her activity.  She denies dry eyes/mouth, difficulty swallowing, gland swelling, cough, wheeze, sputum, hemoptysis, fever, sweats, weight loss, skin rash, joint or leg  swelling, or GI symptoms.  Physical Exam:   Appearance - well kempt   ENMT - no sinus tenderness, no oral exudate, no LAN, Mallampati 3 airway, no stridor  Respiratory - equal breath sounds bilaterally, no wheezing or rales  CV - s1s2 regular rate and rhythm, no murmurs  Ext - no clubbing, no edema  Skin - no rashes  Psych - normal mood and affect   Pulmonary testing:    Chest Imaging:  CT chest 03/06/21 >> mediastinal LAN with areas of calcification up to 16 mm, RLL 6 mm nodule, 5 mm nodule RUL, subpleural reticulation, mild cylindrical BTX at bases, tiny HH  Cardiac Tests:    Social History:  She  reports that she quit smoking about 33 years ago. Her smoking use included cigarettes. She has never used smokeless tobacco. She reports current alcohol use. She reports that she does not use drugs.  Family History:  Her family history includes Diabetes in her father, maternal grandmother, mother, sister, and sister; Hypertension in her paternal grandmother; Lupus in her daughter; Stomach cancer in her maternal grandfather; Stroke in her father and mother.    Discussion:  She has work place exposure to yarn.  She reports possible work place exposure to asbestos several years ago, and possible exposure while living with her grandfather as a child.  She has possible  exposure to fungal spores while living in [place] area (coccidia).  Her CT chest from February 2023 shows changes as detailed above.  She doesn't have significant respiratory symptoms at this time.  Assessment/Plan:   Abnormal CT chest. - will arrange for CBC with diff, CMET, ANA, RF, ANCA, ESRD, CT chest with IV contrast, and PFT - further interventions to be determined based on above results  Time Spent Involved in Patient Care on Day of Examination:  50 minutes  Follow up:   Patient Instructions  Lab tests today  Will schedule CT chest with IV [facility]  Will schedule  pulmonary function test in [place] office and follow up after that in 6 to 8 weeks  Medication List:   Allergies as of 08/03/2021       Reactions   Tramadol Nausea And Vomiting   Metformin And Related Other (See Comments)   Headaches        Medication List        Accurate as of August 03, 2021  9:57 AM. If you have any questions, ask your nurse or doctor.          aspirin EC 81 MG tablet Take 1 tablet (81 mg total) by mouth daily. Swallow whole.   atorvastatin 10 MG tablet Commonly known as: LIPITOR Take 1 tablet (10 mg total) by mouth daily.   FreeStyle Libre 3 Sensor Misc 1 Device by Does not apply route every 14 (fourteen) days. Place 1 sensor on the skin every 14 days. Use to check glucose continuously   insulin glargine 100 UNIT/ML injection Commonly known as: LANTUS Inject 15 Units into the skin daily.   Insulin Pen Needle 32G X 4 MM Misc Use with insulin daily Dx E11.69   Trulicity 0.75 MG/0.5ML Sopn Generic drug: Dulaglutide Inject 0.75 mg into the skin once a week. DX: E11.65        Signature:  Coralyn Helling, MD [facility] Pager - [phone] 08/03/2021, 9:57 AM

## 2021-08-05 LAB — RHEUMATOID FACTOR: Rheumatoid fact SerPl-aCnc: 34.2 IU/mL — ABNORMAL HIGH (ref <14.0)

## 2021-08-05 LAB — COMPREHENSIVE METABOLIC PANEL
ALT: 24 IU/L (ref 0–32)
AST: 22 IU/L (ref 0–40)
Albumin/Globulin Ratio: 1.3 (ref 1.2–2.2)
Albumin: 4.2 g/dL (ref 3.8–4.9)
Alkaline Phosphatase: 92 IU/L (ref 44–121)
BUN/Creatinine Ratio: 24 (ref 12–28)
BUN: 17 mg/dL (ref 8–27)
Bilirubin Total: 0.5 mg/dL (ref 0.0–1.2)
CO2: 22 mmol/L (ref 20–29)
Calcium: 9.6 mg/dL (ref 8.7–10.3)
Chloride: 102 mmol/L (ref 96–106)
Creatinine, Ser: 0.7 mg/dL (ref 0.57–1.00)
Globulin, Total: 3.3 g/dL (ref 1.5–4.5)
Glucose: 199 mg/dL — ABNORMAL HIGH (ref 70–99)
Potassium: 4.4 mmol/L (ref 3.5–5.2)
Sodium: 139 mmol/L (ref 134–144)
Total Protein: 7.5 g/dL (ref 6.0–8.5)
eGFR: 99 mL/min/{1.73_m2} (ref >59)

## 2021-08-05 LAB — CBC WITH DIFFERENTIAL/PLATELET
Basophils Absolute: 0 10*3/uL (ref 0.0–0.2)
Basos: 0 %
EOS (ABSOLUTE): 0.2 10*3/uL (ref 0.0–0.4)
Eos: 2 %
Hematocrit: 39.6 % (ref 34.0–46.6)
Hemoglobin: 12.9 g/dL (ref 11.1–15.9)
Immature Grans (Abs): 0 10*3/uL (ref 0.0–0.1)
Immature Granulocytes: 0 %
Lymphocytes Absolute: 1.4 10*3/uL (ref 0.7–3.1)
Lymphs: 17 %
MCH: 29.3 pg (ref 26.6–33.0)
MCHC: 32.6 g/dL (ref 31.5–35.7)
MCV: 90 fL (ref 79–97)
Monocytes Absolute: 0.5 10*3/uL (ref 0.1–0.9)
Monocytes: 6 %
Neutrophils Absolute: 6.2 10*3/uL (ref 1.4–7.0)
Neutrophils: 75 %
Platelets: 276 10*3/uL (ref 150–450)
RBC: 4.4 x10E6/uL (ref 3.77–5.28)
RDW: 12 % (ref 11.7–15.4)
WBC: 8.3 10*3/uL (ref 3.4–10.8)

## 2021-08-05 LAB — ANGIOTENSIN CONVERTING ENZYME: Angio Convert Enzyme: 82 U/L (ref 14–82)

## 2021-08-05 LAB — SEDIMENTATION RATE: Sed Rate: 31 mm/hr (ref 0–40)

## 2021-08-05 LAB — ANCA PROFILE
Anti-MPO Antibodies: <0.2 units (ref 0.0–0.9)
Anti-PR3 Antibodies: <0.2 units (ref 0.0–0.9)
Atypical pANCA: <1:20 {titer}
C-ANCA: <1:20 {titer}
P-ANCA: <1:20 {titer}

## 2021-08-05 LAB — ANA W/REFLEX: ANA Titer 1: NEGATIVE

## 2021-08-29 ENCOUNTER — Encounter: Payer: Self-pay | Admitting: *Deleted

## 2021-09-05 ENCOUNTER — Ambulatory Visit (HOSPITAL_COMMUNITY): Payer: BC Managed Care – PPO

## 2021-09-30 ENCOUNTER — Ambulatory Visit: Payer: BC Managed Care – PPO | Admitting: Pulmonary Disease

## 2021-11-11 ENCOUNTER — Encounter: Payer: Self-pay | Admitting: Family Medicine

## 2021-11-11 ENCOUNTER — Ambulatory Visit: Payer: BC Managed Care – PPO | Admitting: Family Medicine

## 2021-11-11 VITALS — BP 137/76 | HR 73 | Temp 97.3°F | Ht 68.0 in | Wt 191.2 lb

## 2021-11-11 DIAGNOSIS — M5442 Lumbago with sciatica, left side: Secondary | ICD-10-CM | POA: Diagnosis not present

## 2021-11-11 DIAGNOSIS — E1165 Type 2 diabetes mellitus with hyperglycemia: Secondary | ICD-10-CM | POA: Diagnosis not present

## 2021-11-11 DIAGNOSIS — I700 Atherosclerosis of aorta: Secondary | ICD-10-CM

## 2021-11-11 DIAGNOSIS — E785 Hyperlipidemia, unspecified: Secondary | ICD-10-CM

## 2021-11-11 DIAGNOSIS — E1169 Type 2 diabetes mellitus with other specified complication: Secondary | ICD-10-CM

## 2021-11-11 LAB — BAYER DCA HB A1C WAIVED: HB A1C (BAYER DCA - WAIVED): 8.9 % — ABNORMAL HIGH (ref 4.8–5.6)

## 2021-11-11 MED ORDER — TRULICITY 0.75 MG/0.5ML ~~LOC~~ SOAJ: 0.7500 mg | SUBCUTANEOUS | 2 refills | Status: DC

## 2021-11-11 MED ORDER — ENALAPRIL MALEATE 2.5 MG PO TABS: 2.5000 mg | ORAL_TABLET | Freq: Every day | ORAL | 1 refills | Status: DC

## 2021-11-11 MED ORDER — METHYLPREDNISOLONE ACETATE 40 MG/ML IJ SUSP
40.0000 mg | Freq: Once | INTRAMUSCULAR | Status: AC
  Administered 2021-11-11: 40 mg via INTRAMUSCULAR

## 2021-11-11 MED ORDER — ATORVASTATIN CALCIUM 10 MG PO TABS: 10.0000 mg | ORAL_TABLET | Freq: Every day | ORAL | 1 refills | Status: DC

## 2021-11-11 NOTE — Patient Instructions (Signed)

## 2021-11-11 NOTE — Progress Notes (Signed)
Subjective:  Patient ID: Catherine Chapman, female    DOB: 12-Mar-1960, 61 y.o.   MRN: [id number]  Patient Care Team: Baruch Gouty, FNP as PCP - General (Family Medicine) Harlen Labs, MD as Referring Physician (Optometry)   Chief Complaint:  Establish Care Blanch Media patient /), Diabetes, and Flank Pain (Left sided lower back pain and flank pain. Patient states it has been going on a few weeks but got worse Monday. )   HPI: Catherine Chapman is a 61 y.o. female presenting on 11/11/2021 for Establish Care Blanch Media patient /), Diabetes, and Flank Pain (Left sided lower back pain and flank pain. Patient states it has been going on a few weeks but got worse Monday. )   Pt presents today to establish care with new PCP as prior PCP is not longer at this office. She is following up for management of chronic medical conditions and with complaints of  lower left sided back pain.   1. Type 2 diabetes mellitus with hyperglycemia, without long-term current use of insulin ([place]) States she has been out of her trulicity for over 3 weeks. She has not been checking her blood sugar on a regular basis. Denies polyuria, polyphagia, or polydipsia. She is on statin and ASA therapy but not on ACEi or ARB.   2. Aortic atherosclerosis (HCC) Asymptomatic. On ASA and statin therapy.   3. Hyperlipidemia associated with type 2 diabetes mellitus ([place]) On statin therapy and tolerating well. Denies myalgias. She does not follow a diet or exercise routine but is very active at work. States she works 12 hour shifts and is on her feet for the majority of the shift.   4. Lower back pain Pt reports lower back pain over the last several weeks, starting getting worse on Monday. Denies injury. States pain is sharp and shooting, radiates to left leg, and worse with movement. No bowel or bladder incontinence. No saddle anesthesia. States she has been taking tylenol with some relief of symptoms.    Relevant past medical, surgical,  family, and social history reviewed and updated as indicated.  Allergies and medications reviewed and updated. Data reviewed: Chart in Epic.   Past Medical History:  Diagnosis Date   Aortic atherosclerosis ([place])    Back pain    Diabetes ([place])    Mediastinal lymphadenopathy 03/06/2021   Chest CT at [facility]   Pulmonary nodule 03/06/2021   [facility]    Past Surgical History:  Procedure Laterality Date   I & D EXTREMITY Left 01/17/2017   Procedure: IRRIGATION AND DEBRIBEMENT WITH OPEN REDUCTION INTERNAL FIXATION LEFT INDEX FINGER WITH EXTENSOR TENDON REPAIR;  Surgeon: Charlotte Crumb, MD;  Location: [facility];  Service: Orthopedics;  Laterality: Left;   KNEE SURGERY     KNEE SURGERY     ROTATOR CUFF REPAIR      Social History   Socioeconomic History   Marital status: Single    Spouse name: Not on file   Number of children: Not on file   Years of education: Not on file   Highest education level: Not on file  Occupational History   Not on file  Tobacco Use   Smoking status: Former    Types: Cigarettes    Quit date: 56    Years since quitting: 33.8   Smokeless tobacco: Never  Vaping Use   Vaping Use: Never used  Substance and Sexual Activity   Alcohol use: Yes  Comment: occ   Drug use: No   Sexual activity: Yes    Birth control/protection: None  Other Topics Concern   Not on file  Social History Narrative   Not on file   Social Determinants of Health   Financial Resource Strain: Not on file  Food Insecurity: Not on file  Transportation Needs: Not on file  Physical Activity: Not on file  Stress: Not on file  Social Connections: Not on file  Intimate Partner Violence: Not on file    Outpatient Encounter Medications as of 11/11/2021  Medication Sig   aspirin 81 MG EC tablet Take 1 tablet (81 mg total) by mouth daily. Swallow whole.   Continuous Blood Gluc Sensor (FREESTYLE LIBRE 3 SENSOR) MISC 1 Device by Does not apply route every 14  (fourteen) days. Place 1 sensor on the skin every 14 days. Use to check glucose continuously   enalapril (VASOTEC) 2.5 MG tablet Take 1 tablet (2.5 mg total) by mouth daily.   insulin glargine (LANTUS) 100 UNIT/ML injection Inject 15 Units into the skin daily.   Insulin Pen Needle 32G X 4 MM MISC Use with insulin daily Dx E11.69   [DISCONTINUED] atorvastatin (LIPITOR) 10 MG tablet Take 1 tablet (10 mg total) by mouth daily.   [DISCONTINUED] Dulaglutide (TRULICITY) 5.88 FO/2.7XA SOPN Inject 0.75 mg into the skin once a week. DX: E11.65   atorvastatin (LIPITOR) 10 MG tablet Take 1 tablet (10 mg total) by mouth daily.   Dulaglutide (TRULICITY) 1.28 NO/6.7EH SOPN Inject 0.75 mg into the skin once a week. DX: E11.65   [EXPIRED] methylPREDNISolone acetate (DEPO-MEDROL) injection 40 mg    No facility-administered encounter medications on file as of 11/11/2021.    Allergies  Allergen Reactions   Tramadol Nausea And Vomiting   Metformin Other (See Comments)    Headaches   Metformin And Related Other (See Comments)    Headaches    Review of Systems  Constitutional:  Negative for activity change, appetite change, chills, diaphoresis, fatigue, fever and unexpected weight change.  HENT: Negative.    Eyes: Negative.  Negative for photophobia and visual disturbance.  Respiratory:  Negative for cough, chest tightness and shortness of breath.   Cardiovascular:  Negative for chest pain, palpitations and leg swelling.  Gastrointestinal:  Negative for abdominal pain, blood in stool, constipation, diarrhea, nausea and vomiting.  Endocrine: Negative.  Negative for polydipsia, polyphagia and polyuria.  Genitourinary:  Negative for decreased urine volume, difficulty urinating, dysuria, frequency and urgency.  Musculoskeletal:  Positive for arthralgias, back pain and myalgias. Negative for gait problem, joint swelling, neck pain and neck stiffness.  Skin: Negative.   Allergic/Immunologic: Negative.    Neurological:  Negative for dizziness, tremors, seizures, syncope, facial asymmetry, speech difficulty, weakness, light-headedness, numbness and headaches.  Hematological: Negative.   Psychiatric/Behavioral:  Negative for confusion, hallucinations, sleep disturbance and suicidal ideas.   All other systems reviewed and are negative.       Objective:  BP 137/76   Pulse 73   Temp (!) 97.3 F (36.3 C) (Temporal)   Ht '5\' 8"'  (1.727 m)   Wt 191 lb 3.2 oz (86.7 kg)   SpO2 97%   BMI 29.07 kg/m    Wt Readings from Last 3 Encounters:  11/11/21 191 lb 3.2 oz (86.7 kg)  08/03/21 189 lb (85.7 kg)  05/12/21 185 lb 3.2 oz (84 kg)    Physical Exam Vitals and nursing note reviewed.  Constitutional:      General: She is not  in acute distress.    Appearance: Normal appearance. She is well-developed and well-groomed. She is not ill-appearing, toxic-appearing or diaphoretic.  HENT:     Head: Normocephalic and atraumatic.     Jaw: There is normal jaw occlusion.     Right Ear: Hearing normal.     Left Ear: Hearing normal.     Nose: Nose normal.     Mouth/Throat:     Lips: Pink.     Mouth: Mucous membranes are moist.     Pharynx: Oropharynx is clear. Uvula midline.  Eyes:     General: Lids are normal.     Extraocular Movements: Extraocular movements intact.     Conjunctiva/sclera: Conjunctivae normal.     Pupils: Pupils are equal, round, and reactive to light.  Neck:     Thyroid: No thyroid mass, thyromegaly or thyroid tenderness.     Vascular: No carotid bruit or JVD.     Trachea: Trachea and phonation normal.  Cardiovascular:     Rate and Rhythm: Normal rate and regular rhythm.     Chest Wall: PMI is not displaced.     Pulses: Normal pulses.     Heart sounds: Normal heart sounds. No murmur heard.    No friction rub. No gallop.  Pulmonary:     Effort: Pulmonary effort is normal. No respiratory distress.     Breath sounds: Normal breath sounds. No wheezing.  Abdominal:      General: Bowel sounds are normal. There is no distension or abdominal bruit.     Palpations: Abdomen is soft. There is no hepatomegaly or splenomegaly.     Tenderness: There is no abdominal tenderness. There is no right CVA tenderness or left CVA tenderness.     Hernia: No hernia is present.  Musculoskeletal:     Cervical back: Normal range of motion and neck supple.     Thoracic back: Normal.     Lumbar back: Tenderness present. No swelling, edema, deformity, signs of trauma, lacerations, spasms or bony tenderness. Decreased range of motion. Positive left straight leg raise test. Negative right straight leg raise test. No scoliosis.     Right hip: Normal.     Left hip: Normal.     Right lower leg: No edema.     Left lower leg: No edema.  Lymphadenopathy:     Cervical: No cervical adenopathy.  Skin:    General: Skin is warm and dry.     Capillary Refill: Capillary refill takes less than 2 seconds.     Coloration: Skin is not cyanotic, jaundiced or pale.     Findings: No rash.  Neurological:     General: No focal deficit present.     Mental Status: She is alert and oriented to person, place, and time.     Sensory: Sensation is intact.     Motor: Motor function is intact.     Coordination: Coordination is intact.     Gait: Gait is intact.     Deep Tendon Reflexes: Reflexes are normal and symmetric.  Psychiatric:        Attention and Perception: Attention and perception normal.        Mood and Affect: Mood and affect normal.        Speech: Speech normal.        Behavior: Behavior normal. Behavior is cooperative.        Thought Content: Thought content normal.        Cognition and Memory: Cognition and memory normal.  Judgment: Judgment normal.     Results for orders placed or performed in visit on 11/11/21  Bayer DCA Hb A1c Waived  Result Value Ref Range   HB A1C (BAYER DCA - WAIVED) 8.9 (H) 4.8 - 5.6 %       Pertinent labs & imaging results that were available during  my care of the patient were reviewed by me and considered in my medical decision making.  Assessment & Plan:  Avagail was seen today for establish care, diabetes and flank pain.  Diagnoses and all orders for this visit:  Type 2 diabetes mellitus with hyperglycemia, without long-term current use of insulin (HCC) A1C 8.9, has been out of Trulicity for the past 3 weeks. Will refill today. Start low dose ACEi for renal protection. Monitor BS and report any persistent hjgh readings. Follow up in 3 months for repeat labs.  -     Lipid panel -     Bayer DCA Hb A1c Waived -     CMP14+EGFR -     atorvastatin (LIPITOR) 10 MG tablet; Take 1 tablet (10 mg total) by mouth daily. -     Dulaglutide (TRULICITY) 0.94 BS/9.6GE SOPN; Inject 0.75 mg into the skin once a week. DX: E11.65 -     enalapril (VASOTEC) 2.5 MG tablet; Take 1 tablet (2.5 mg total) by mouth daily.  Aortic atherosclerosis (HCC) Continue ASA and statin therapy.  -     atorvastatin (LIPITOR) 10 MG tablet; Take 1 tablet (10 mg total) by mouth daily.  Hyperlipidemia associated with type 2 diabetes mellitus ([place]) Diet encouraged - increase intake of fresh fruits and vegetables, increase intake of lean proteins. Bake, broil, or grill foods. Avoid fried, greasy, and fatty foods. Avoid fast foods. Increase intake of fiber-rich whole grains. Exercise encouraged - at least 150 minutes per week and advance as tolerated. Goal BMI < 25. Continue medications as prescribed. Follow up in 3-6 months as discussed.  -     atorvastatin (LIPITOR) 10 MG tablet; Take 1 tablet (10 mg total) by mouth daily.  Acute left-sided low back pain with left-sided sciatica No red flags concerning for cauda equina syndrome. Will burst with steroids in office. Symptomatic care at home discussed in detail. Report new, worsening, or persistent symptoms.  -     methylPREDNISolone acetate (DEPO-MEDROL) injection 40 mg     Continue all other maintenance medications.  Follow  up plan: Return in about 3 months (around 02/11/2022), or if symptoms worsen or fail to improve, for DM.   Continue healthy lifestyle choices, including diet (rich in fruits, vegetables, and lean proteins, and low in salt and simple carbohydrates) and exercise (at least 30 minutes of moderate physical activity daily).  Educational handout given for sciatica   The above assessment and management plan was discussed with the patient. The patient verbalized understanding of and has agreed to the management plan. Patient is aware to call the clinic if they develop any new symptoms or if symptoms persist or worsen. Patient is aware when to return to the clinic for a follow-up visit. Patient educated on when it is appropriate to go to the emergency department.   Monia Pouch, FNP-C [facility] [phone]

## 2021-11-12 LAB — LIPID PANEL
Chol/HDL Ratio: 3.2 ratio (ref 0.0–4.4)
Cholesterol, Total: 191 mg/dL (ref 100–199)
HDL: 59 mg/dL (ref >39)
LDL Chol Calc (NIH): 117 mg/dL — ABNORMAL HIGH (ref 0–99)
Triglycerides: 81 mg/dL (ref 0–149)
VLDL Cholesterol Cal: 15 mg/dL (ref 5–40)

## 2021-11-12 LAB — CMP14+EGFR
ALT: 35 IU/L — ABNORMAL HIGH (ref 0–32)
AST: 31 IU/L (ref 0–40)
Albumin/Globulin Ratio: 1.1 — ABNORMAL LOW (ref 1.2–2.2)
Albumin: 4 g/dL (ref 3.9–4.9)
Alkaline Phosphatase: 128 IU/L — ABNORMAL HIGH (ref 44–121)
BUN/Creatinine Ratio: 27 (ref 12–28)
BUN: 20 mg/dL (ref 8–27)
Bilirubin Total: 0.3 mg/dL (ref 0.0–1.2)
CO2: 23 mmol/L (ref 20–29)
Calcium: 9.7 mg/dL (ref 8.7–10.3)
Chloride: 96 mmol/L (ref 96–106)
Creatinine, Ser: 0.75 mg/dL (ref 0.57–1.00)
Globulin, Total: 3.6 g/dL (ref 1.5–4.5)
Glucose: 184 mg/dL — ABNORMAL HIGH (ref 70–99)
Potassium: 4.3 mmol/L (ref 3.5–5.2)
Sodium: 135 mmol/L (ref 134–144)
Total Protein: 7.6 g/dL (ref 6.0–8.5)
eGFR: 91 mL/min/{1.73_m2} (ref >59)

## 2021-11-15 ENCOUNTER — Encounter: Payer: Self-pay | Admitting: Family Medicine

## 2021-11-15 ENCOUNTER — Ambulatory Visit: Payer: BC Managed Care – PPO | Admitting: Family Medicine

## 2021-11-15 ENCOUNTER — Ambulatory Visit (INDEPENDENT_AMBULATORY_CARE_PROVIDER_SITE_OTHER): Payer: BC Managed Care – PPO

## 2021-11-15 VITALS — BP 137/79 | HR 85 | Temp 98.0°F | Ht 68.0 in | Wt 191.0 lb

## 2021-11-15 DIAGNOSIS — K5900 Constipation, unspecified: Secondary | ICD-10-CM

## 2021-11-15 DIAGNOSIS — M5442 Lumbago with sciatica, left side: Secondary | ICD-10-CM

## 2021-11-15 MED ORDER — POLYETHYLENE GLYCOL 3350 17 GM/SCOOP PO POWD: 17.0000 g | Freq: Every day | ORAL | 1 refills | Status: DC

## 2021-11-15 NOTE — Progress Notes (Signed)
Subjective:  Patient ID: Catherine Chapman, female    DOB: 19-Dec-1960, 61 y.o.   MRN: [id number]  Patient Care Team: Baruch Gouty, FNP as PCP - General (Family Medicine) Harlen Labs, MD as Referring Physician [facility])   Chief Complaint:  Hip Pain, Back Pain, and Leg Pain   HPI: Catherine Chapman is a 61 y.o. female presenting on 11/15/2021 for Hip Pain, Back Pain, and Leg Pain   Pt presents today with complaints of continued bilateral lower back pain. States now she is having cramping in her lower stomach. She was treated with a burst of steroids for sciatica. States this was mildly beneficial. States the leg pain has subsided but she continues to have the lower back and now lower abdominal pain. Aching, cramping, and shooting in nature, moderate, nothing seems to help with the pain.   Relevant past medical, surgical, family, and social history reviewed and updated as indicated.  Allergies and medications reviewed and updated. Data reviewed: Chart in Epic.   Past Medical History:  Diagnosis Date   Aortic atherosclerosis ([place])    Back pain    Diabetes ([place])    Mediastinal lymphadenopathy 03/06/2021   Chest CT at [facility]   Pulmonary nodule 03/06/2021   Chest CT at [facility]    Past Surgical History:  Procedure Laterality Date   I & D EXTREMITY Left 01/17/2017   Procedure: IRRIGATION AND DEBRIBEMENT WITH OPEN REDUCTION INTERNAL FIXATION LEFT INDEX FINGER WITH EXTENSOR TENDON REPAIR;  Surgeon: Charlotte Crumb, MD;  Location: [facility];  Service: Orthopedics;  Laterality: Left;   KNEE SURGERY     KNEE SURGERY     ROTATOR CUFF REPAIR      Social History   Socioeconomic History   Marital status: Single    Spouse name: Not on file   Number of children: Not on file   Years of education: Not on file   Highest education level: Not on file  Occupational History   Not on file  Tobacco Use   Smoking status: Former    Types: Cigarettes    Quit date: 69     Years since quitting: 33.8   Smokeless tobacco: Never  Vaping Use   Vaping Use: Never used  Substance and Sexual Activity   Alcohol use: Yes    Comment: occ   Drug use: No   Sexual activity: Yes    Birth control/protection: None  Other Topics Concern   Not on file  Social History Narrative   Not on file   Social Determinants of Health   Financial Resource Strain: Not on file  Food Insecurity: Not on file  Transportation Needs: Not on file  Physical Activity: Not on file  Stress: Not on file  Social Connections: Not on file  Intimate Partner Violence: Not on file    Outpatient Encounter Medications as of 11/15/2021  Medication Sig   aspirin 81 MG EC tablet Take 1 tablet (81 mg total) by mouth daily. Swallow whole.   atorvastatin (LIPITOR) 10 MG tablet Take 1 tablet (10 mg total) by mouth daily.   Continuous Blood Gluc Sensor (FREESTYLE LIBRE 3 SENSOR) MISC 1 Device by Does not apply route every 14 (fourteen) days. Place 1 sensor on the skin every 14 days. Use to check glucose continuously   Dulaglutide (TRULICITY) 7.35 HG/9.9ME SOPN Inject 0.75 mg into the skin once a week. DX: E11.65   enalapril (VASOTEC) 2.5 MG tablet Take 1 tablet (2.5  mg total) by mouth daily.   insulin glargine (LANTUS) 100 UNIT/ML injection Inject 15 Units into the skin daily.   Insulin Pen Needle 32G X 4 MM MISC Use with insulin daily Dx E11.69   polyethylene glycol powder (GLYCOLAX/MIRALAX) 17 GM/SCOOP powder Take 17 g by mouth daily.   No facility-administered encounter medications on file as of 11/15/2021.    Allergies  Allergen Reactions   Tramadol Nausea And Vomiting   Metformin Other (See Comments)    Headaches   Metformin And Related Other (See Comments)    Headaches    Review of Systems  Constitutional:  Positive for activity change. Negative for appetite change, chills, diaphoresis, fatigue, fever and unexpected weight change.  Respiratory:  Negative for cough and shortness of breath.    Cardiovascular:  Negative for chest pain, palpitations and leg swelling.  Gastrointestinal:  Positive for abdominal pain and constipation. Negative for abdominal distention, anal bleeding, blood in stool, diarrhea, nausea, rectal pain and vomiting.  Genitourinary:  Negative for decreased urine volume and difficulty urinating.  Musculoskeletal:  Positive for arthralgias, back pain and gait problem. Negative for joint swelling, myalgias, neck pain and neck stiffness.  Neurological:  Negative for dizziness, weakness and headaches.  Psychiatric/Behavioral:  Negative for confusion.   All other systems reviewed and are negative.       Objective:  BP 137/79   Pulse 85   Temp 98 F (36.7 C)   Ht _0  (1.727 m)   Wt 191 lb (86.6 kg)   SpO2 97%   BMI 29.04 kg/m    Wt Readings from Last 3 Encounters:  11/15/21 191 lb (86.6 kg)  11/11/21 191 lb 3.2 oz (86.7 kg)  08/03/21 189 lb (85.7 kg)    Physical Exam Vitals and nursing note reviewed.  Constitutional:      General: She is not in acute distress.    Appearance: Normal appearance. She is well-developed, well-groomed and overweight. She is not ill-appearing, toxic-appearing or diaphoretic.  HENT:     Head: Normocephalic and atraumatic.     Jaw: There is normal jaw occlusion.     Right Ear: Hearing normal.     Left Ear: Hearing normal.     Nose: Nose normal.     Mouth/Throat:     Lips: Pink.     Mouth: Mucous membranes are moist.     Pharynx: Oropharynx is clear. Uvula midline.  Eyes:     General: Lids are normal.     Extraocular Movements: Extraocular movements intact.     Conjunctiva/sclera: Conjunctivae normal.     Pupils: Pupils are equal, round, and reactive to light.  Neck:     Thyroid: No thyroid mass, thyromegaly or thyroid tenderness.     Vascular: No carotid bruit or JVD.     Trachea: Trachea and phonation normal.  Cardiovascular:     Rate and Rhythm: Regular rhythm.     Chest Wall: PMI is not displaced.      Pulses: Normal pulses.     Heart sounds: Normal heart sounds. No murmur heard.    No friction rub. No gallop.  Pulmonary:     Effort: Pulmonary effort is normal. No respiratory distress.     Breath sounds: Normal breath sounds. No wheezing.  Abdominal:     General: Bowel sounds are increased. There is no distension or abdominal bruit.     Palpations: Abdomen is soft. There is no hepatomegaly, splenomegaly or mass.     Tenderness: There is  abdominal tenderness in the periumbilical area and suprapubic area. There is no right CVA tenderness, left CVA tenderness, guarding or rebound. Negative signs include Murphy's sign and McBurney's sign.     Hernia: No hernia is present.  Musculoskeletal:        General: Normal range of motion.     Cervical back: Normal range of motion and neck supple.     Thoracic back: Normal.     Lumbar back: Tenderness present. No swelling, edema, deformity, signs of trauma, lacerations, spasms or bony tenderness. Normal range of motion. Negative right straight leg raise test and negative left straight leg raise test. No scoliosis.     Right hip: Normal.     Left hip: Normal.     Right lower leg: No edema.     Left lower leg: No edema.  Lymphadenopathy:     Cervical: No cervical adenopathy.  Skin:    General: Skin is warm and dry.     Capillary Refill: Capillary refill takes less than 2 seconds.     Coloration: Skin is not cyanotic, jaundiced or pale.     Findings: No rash.  Neurological:     General: No focal deficit present.     Mental Status: She is alert and oriented to person, place, and time.     Sensory: Sensation is intact.     Motor: Motor function is intact.     Coordination: Coordination is intact.     Gait: Gait abnormal (using cane, in wheelchair).     Deep Tendon Reflexes: Reflexes are normal and symmetric.  Psychiatric:        Attention and Perception: Attention and perception normal.        Mood and Affect: Mood and affect normal.         Speech: Speech normal.        Behavior: Behavior normal. Behavior is cooperative.        Thought Content: Thought content normal.        Cognition and Memory: Cognition and memory normal.        Judgment: Judgment normal.     Results for orders placed or performed in visit on 11/11/21  Lipid panel  Result Value Ref Range   Cholesterol, Total 191 100 - 199 mg/dL   Triglycerides 81 0 - 149 mg/dL   HDL 59 >39 mg/dL   VLDL Cholesterol Cal 15 5 - 40 mg/dL   LDL Chol Calc (NIH) 117 (H) 0 - 99 mg/dL   Chol/HDL Ratio 3.2 0.0 - 4.4 ratio  Bayer DCA Hb A1c Waived  Result Value Ref Range   HB A1C (BAYER DCA - WAIVED) 8.9 (H) 4.8 - 5.6 %  CMP14+EGFR  Result Value Ref Range   Glucose 184 (H) 70 - 99 mg/dL   BUN 20 8 - 27 mg/dL   Creatinine, Ser 0.75 0.57 - 1.00 mg/dL   eGFR 91 >59 mL/min/1.73   BUN/Creatinine Ratio 27 12 - 28   Sodium 135 134 - 144 mmol/L   Potassium 4.3 3.5 - 5.2 mmol/L   Chloride 96 96 - 106 mmol/L   CO2 23 20 - 29 mmol/L   Calcium 9.7 8.7 - 10.3 mg/dL   Total Protein 7.6 6.0 - 8.5 g/dL   Albumin 4.0 3.9 - 4.9 g/dL   Globulin, Total 3.6 1.5 - 4.5 g/dL   Albumin/Globulin Ratio 1.1 (L) 1.2 - 2.2   Bilirubin Total 0.3 0.0 - 1.2 mg/dL   Alkaline Phosphatase 128 (H) 44 -  121 IU/L   AST 31 0 - 40 IU/L   ALT 35 (H) 0 - 32 IU/L     X-Ray: lumbar spine: No acute bony findings, significant stool burden. Preliminary x-ray reading by Monia Pouch, FNP-C, [facility].   Pertinent labs & imaging results that were available during my care of the patient were reviewed by me and considered in my medical decision making.  Assessment & Plan:  Lauryn was seen today for hip pain, back pain and leg pain.  Diagnoses and all orders for this visit:  Acute left-sided low back pain with left-sided sciatica No acute bony abnormalities on imaging. Significant stool burden.  -     DG Lumbar Spine 2-3 Views  Constipation in female Miralax clean out discussed in detail along with red flags.  Report new, worsening, or persistent symptoms.  -     polyethylene glycol powder (GLYCOLAX/MIRALAX) 17 GM/SCOOP powder; Take 17 g by mouth daily.     Continue all other maintenance medications.  Follow up plan: Return if symptoms worsen or fail to improve.   Continue healthy lifestyle choices, including diet (rich in fruits, vegetables, and lean proteins, and low in salt and simple carbohydrates) and exercise (at least 30 minutes of moderate physical activity daily).  Educational handout given for constipation with instructions on Miralax clean out  The above assessment and management plan was discussed with the patient. The patient verbalized understanding of and has agreed to the management plan. Patient is aware to call the clinic if they develop any new symptoms or if symptoms persist or worsen. Patient is aware when to return to the clinic for a follow-up visit. Patient educated on when it is appropriate to go to the emergency department.   Monia Pouch, FNP-C [facility] [phone]

## 2021-11-15 NOTE — Patient Instructions (Signed)
Thank you for coming in to clinic today.  1. Your symptoms are consistent with Constipation, likely cause of your General Abdominal Pain / Cramping. 2. Start with Miralax, prescription was sent to pharmacy. First dose 68g (4 capfuls) in 32oz water over 1 to 2 hours for clean out. Next day start 17g or 1 capful daily, may adjust dose up or down by half a capful every few days. Recommend to take this medicine daily for next 1-2 weeks, you may need to use it longer if needed. - Goal is to have soft regular bowel movement 1-3x daily, if too runny or diarrhea, then reduce dose of the medicine to every other day.  Improve water intake, hydration will help Also recommend increased vegetables, fruits, fiber intake Can try daily Metamucil or Fiber supplement at pharmacy over the counter  Follow-up if symptoms are not improving with bowel movements, or if pain worsens, develop fevers, nausea, vomiting.  Please schedule a follow-up appointment with [name], FNP, in 1 month to follow-up Constipation  If you have any other questions or concerns, please feel free to call the clinic to contact me. You may also schedule an earlier appointment if necessary.  However, if your symptoms get significantly worse, please go to the Emergency Department to seek immediate medical attention.  

## 2021-11-21 ENCOUNTER — Telehealth: Payer: Self-pay | Admitting: Family Medicine

## 2021-11-21 NOTE — Telephone Encounter (Signed)
Patient said she was given two letters to give to her job, one that said she could return without restrictions and one that she had restrictions. Her and her employer are both confused about what she is supposed to do. Per patient she does not think that she is able to return at this time due to the amount of labor that she must do to complete her job. Please call back and advise.

## 2021-11-22 NOTE — Telephone Encounter (Signed)
Pt brought new form in today & appt made for 11/23/21

## 2021-11-23 ENCOUNTER — Ambulatory Visit: Payer: BC Managed Care – PPO | Admitting: Family Medicine

## 2021-11-23 ENCOUNTER — Encounter: Payer: Self-pay | Admitting: Family Medicine

## 2021-11-23 VITALS — BP 122/78 | HR 85 | Temp 96.2°F | Ht 68.0 in | Wt 193.0 lb

## 2021-11-23 DIAGNOSIS — M25552 Pain in left hip: Secondary | ICD-10-CM | POA: Diagnosis not present

## 2021-11-23 MED ORDER — PREDNISONE 10 MG (21) PO TBPK: ORAL_TABLET | ORAL | 0 refills | Status: DC

## 2021-11-23 NOTE — Progress Notes (Signed)
Subjective:  Patient ID: Catherine Chapman, female    DOB: 23-Aug-1960, 61 y.o.   MRN: [id number]  Patient Care Team: Baruch Gouty, FNP as PCP - General (Family Medicine) Harlen Labs, MD as Referring Physician [facility])   Chief Complaint:  Hip Pain (Ongoing left hip pain with sciatica.  Patient states she cant work. )   HPI: Catherine Chapman is a 61 y.o. female presenting on 11/23/2021 for Hip Pain (Ongoing left hip pain with sciatica.  Patient states she cant work. )   Hip Pain  The incident occurred more than 1 week ago. There was no injury mechanism. The pain is present in the left hip and left thigh. The quality of the pain is described as aching, burning and shooting. The pain is moderate. The pain has been Constant since onset. Pertinent negatives include no inability to bear weight, loss of motion, loss of sensation, muscle weakness, numbness or tingling. She reports no foreign bodies present. The symptoms are aggravated by movement, palpation and weight bearing. She has tried acetaminophen and NSAIDs for the symptoms. The treatment provided mild relief.    Relevant past medical, surgical, family, and social history reviewed and updated as indicated.  Allergies and medications reviewed and updated. Data reviewed: Chart in Epic.   Past Medical History:  Diagnosis Date   Aortic atherosclerosis ([place])    Back pain    Diabetes ([place])    Mediastinal lymphadenopathy 03/06/2021   Chest CT at [facility]   Pulmonary nodule 03/06/2021   Chest CT at [facility]    Past Surgical History:  Procedure Laterality Date   I & D EXTREMITY Left 01/17/2017   Procedure: IRRIGATION AND DEBRIBEMENT WITH OPEN REDUCTION INTERNAL FIXATION LEFT INDEX FINGER WITH EXTENSOR TENDON REPAIR;  Surgeon: Charlotte Crumb, MD;  Location: [facility];  Service: Orthopedics;  Laterality: Left;   KNEE SURGERY     KNEE SURGERY     ROTATOR CUFF REPAIR      Social History   Socioeconomic History    Marital status: Single    Spouse name: Not on file   Number of children: Not on file   Years of education: Not on file   Highest education level: Not on file  Occupational History   Not on file  Tobacco Use   Smoking status: Former    Types: Cigarettes    Quit date: 22    Years since quitting: 33.8   Smokeless tobacco: Never  Vaping Use   Vaping Use: Never used  Substance and Sexual Activity   Alcohol use: Yes    Comment: occ   Drug use: No   Sexual activity: Yes    Birth control/protection: None  Other Topics Concern   Not on file  Social History Narrative   Not on file   Social Determinants of Health   Financial Resource Strain: Not on file  Food Insecurity: Not on file  Transportation Needs: Not on file  Physical Activity: Not on file  Stress: Not on file  Social Connections: Not on file  Intimate Partner Violence: Not on file    Outpatient Encounter Medications as of 11/23/2021  Medication Sig   aspirin 81 MG EC tablet Take 1 tablet (81 mg total) by mouth daily. Swallow whole.   atorvastatin (LIPITOR) 10 MG tablet Take 1 tablet (10 mg total) by mouth daily.   Continuous Blood Gluc Sensor (FREESTYLE LIBRE 3 SENSOR) MISC 1 Device by Does not apply route  every 14 (fourteen) days. Place 1 sensor on the skin every 14 days. Use to check glucose continuously   Dulaglutide (TRULICITY) 5.94 VO/5.9YT SOPN Inject 0.75 mg into the skin once a week. DX: E11.65   enalapril (VASOTEC) 2.5 MG tablet Take 1 tablet (2.5 mg total) by mouth daily.   insulin glargine (LANTUS) 100 UNIT/ML injection Inject 15 Units into the skin daily.   Insulin Pen Needle 32G X 4 MM MISC Use with insulin daily Dx E11.69   polyethylene glycol powder (GLYCOLAX/MIRALAX) 17 GM/SCOOP powder Take 17 g by mouth daily.   predniSONE (STERAPRED UNI-PAK 21 TAB) 10 MG (21) TBPK tablet As directed x 6 days   No facility-administered encounter medications on file as of 11/23/2021.    Allergies  Allergen Reactions    Tramadol Nausea And Vomiting   Metformin Other (See Comments)    Headaches   Metformin And Related Other (See Comments)    Headaches    Review of Systems  Constitutional:  Negative for activity change, appetite change, chills, diaphoresis, fatigue, fever and unexpected weight change.  HENT: Negative.    Eyes: Negative.   Respiratory:  Negative for cough, chest tightness and shortness of breath.   Cardiovascular:  Negative for chest pain, palpitations and leg swelling.  Gastrointestinal:  Negative for blood in stool, constipation, diarrhea, nausea and vomiting.  Endocrine: Negative.   Genitourinary:  Negative for dysuria, frequency and urgency.  Musculoskeletal:  Positive for arthralgias, gait problem and myalgias.  Skin: Negative.   Allergic/Immunologic: Negative.   Neurological:  Negative for dizziness, tingling, numbness and headaches.  Hematological: Negative.   Psychiatric/Behavioral:  Negative for confusion, hallucinations, sleep disturbance and suicidal ideas.   All other systems reviewed and are negative.       Objective:  BP 122/78   Pulse 85   Temp (!) 96.2 F (35.7 C) (Temporal)   Ht _0  (1.727 m)   Wt 193 lb (87.5 kg)   SpO2 99%   BMI 29.35 kg/m    Wt Readings from Last 3 Encounters:  11/23/21 193 lb (87.5 kg)  11/15/21 191 lb (86.6 kg)  11/11/21 191 lb 3.2 oz (86.7 kg)    Physical Exam Vitals and nursing note reviewed.  Constitutional:      General: She is not in acute distress.    Appearance: Normal appearance. She is well-developed and well-groomed. She is not ill-appearing, toxic-appearing or diaphoretic.  HENT:     Head: Normocephalic and atraumatic.     Jaw: There is normal jaw occlusion.     Right Ear: Hearing normal.     Left Ear: Hearing normal.     Nose: Nose normal.     Mouth/Throat:     Lips: Pink.     Mouth: Mucous membranes are moist.     Pharynx: Oropharynx is clear. Uvula midline.  Eyes:     General: Lids are normal.      Extraocular Movements: Extraocular movements intact.     Conjunctiva/sclera: Conjunctivae normal.     Pupils: Pupils are equal, round, and reactive to light.  Neck:     Thyroid: No thyroid mass, thyromegaly or thyroid tenderness.     Vascular: No carotid bruit or JVD.     Trachea: Trachea and phonation normal.  Cardiovascular:     Rate and Rhythm: Normal rate and regular rhythm.     Chest Wall: PMI is not displaced.     Pulses: Normal pulses.     Heart sounds: Normal heart sounds.  No murmur heard.    No friction rub. No gallop.  Pulmonary:     Effort: Pulmonary effort is normal. No respiratory distress.     Breath sounds: Normal breath sounds. No wheezing.  Abdominal:     General: Bowel sounds are normal. There is no distension or abdominal bruit.     Palpations: Abdomen is soft. There is no hepatomegaly or splenomegaly.     Tenderness: There is no abdominal tenderness. There is no right CVA tenderness or left CVA tenderness.     Hernia: No hernia is present.  Musculoskeletal:     Cervical back: Normal range of motion and neck supple.     Thoracic back: Normal.     Lumbar back: Tenderness present. No swelling, edema, deformity, signs of trauma, lacerations, spasms or bony tenderness. Normal range of motion. Positive left straight leg raise test. Negative right straight leg raise test. No scoliosis.     Right hip: Normal.     Left hip: Tenderness present. No deformity, lacerations, bony tenderness or crepitus. Decreased range of motion. Normal strength.     Right upper leg: Normal.     Left upper leg: Normal.     Right lower leg: No edema.     Left lower leg: No edema.  Lymphadenopathy:     Cervical: No cervical adenopathy.  Skin:    General: Skin is warm and dry.     Capillary Refill: Capillary refill takes less than 2 seconds.     Coloration: Skin is not cyanotic, jaundiced or pale.     Findings: No rash.  Neurological:     General: No focal deficit present.     Mental  Status: She is alert and oriented to person, place, and time.     Sensory: Sensation is intact.     Motor: Motor function is intact.     Coordination: Coordination is intact.     Gait: Gait is intact.     Deep Tendon Reflexes: Reflexes are normal and symmetric.  Psychiatric:        Attention and Perception: Attention and perception normal.        Mood and Affect: Mood and affect normal.        Speech: Speech normal.        Behavior: Behavior normal. Behavior is cooperative.        Thought Content: Thought content normal.        Cognition and Memory: Cognition and memory normal.        Judgment: Judgment normal.     Results for orders placed or performed in visit on 11/11/21  Lipid panel  Result Value Ref Range   Cholesterol, Total 191 100 - 199 mg/dL   Triglycerides 81 0 - 149 mg/dL   HDL 59 >39 mg/dL   VLDL Cholesterol Cal 15 5 - 40 mg/dL   LDL Chol Calc (NIH) 117 (H) 0 - 99 mg/dL   Chol/HDL Ratio 3.2 0.0 - 4.4 ratio  Bayer DCA Hb A1c Waived  Result Value Ref Range   HB A1C (BAYER DCA - WAIVED) 8.9 (H) 4.8 - 5.6 %  CMP14+EGFR  Result Value Ref Range   Glucose 184 (H) 70 - 99 mg/dL   BUN 20 8 - 27 mg/dL   Creatinine, Ser 0.75 0.57 - 1.00 mg/dL   eGFR 91 >59 mL/min/1.73   BUN/Creatinine Ratio 27 12 - 28   Sodium 135 134 - 144 mmol/L   Potassium 4.3 3.5 - 5.2 mmol/L  Chloride 96 96 - 106 mmol/L   CO2 23 20 - 29 mmol/L   Calcium 9.7 8.7 - 10.3 mg/dL   Total Protein 7.6 6.0 - 8.5 g/dL   Albumin 4.0 3.9 - 4.9 g/dL   Globulin, Total 3.6 1.5 - 4.5 g/dL   Albumin/Globulin Ratio 1.1 (L) 1.2 - 2.2   Bilirubin Total 0.3 0.0 - 1.2 mg/dL   Alkaline Phosphatase 128 (H) 44 - 121 IU/L   AST 31 0 - 40 IU/L   ALT 35 (H) 0 - 32 IU/L       Pertinent labs & imaging results that were available during my care of the patient were reviewed by me and considered in my medical decision making.  Assessment & Plan:  Trulee was seen today for hip pain.  Diagnoses and all orders for this  visit:  Left hip pain Persistent symptoms. Will refer to ortho. Will burst with steroids for symptomatic relief. Pt aware to report new, worsening, or persistent symptoms. Pt unable to return to work until cleared by ortho.  -     Ambulatory referral to Orthopedic Surgery -     predniSONE (STERAPRED UNI-PAK 21 TAB) 10 MG (21) TBPK tablet; As directed x 6 days     Continue all other maintenance medications.  Follow up plan: Return if symptoms worsen or fail to improve.   Continue healthy lifestyle choices, including diet (rich in fruits, vegetables, and lean proteins, and low in salt and simple carbohydrates) and exercise (at least 30 minutes of moderate physical activity daily).   The above assessment and management plan was discussed with the patient. The patient verbalized understanding of and has agreed to the management plan. Patient is aware to call the clinic if they develop any new symptoms or if symptoms persist or worsen. Patient is aware when to return to the clinic for a follow-up visit. Patient educated on when it is appropriate to go to the emergency department.   Monia Pouch, FNP-C [facility] [phone]

## 2021-12-01 ENCOUNTER — Encounter: Payer: Self-pay | Admitting: Orthopaedic Surgery

## 2021-12-01 ENCOUNTER — Ambulatory Visit (INDEPENDENT_AMBULATORY_CARE_PROVIDER_SITE_OTHER): Payer: BC Managed Care – PPO | Admitting: Orthopaedic Surgery

## 2021-12-01 VITALS — BP 124/65 | HR 76 | Ht 68.0 in | Wt 193.0 lb

## 2021-12-01 DIAGNOSIS — M545 Low back pain, unspecified: Secondary | ICD-10-CM | POA: Diagnosis not present

## 2021-12-01 DIAGNOSIS — M79605 Pain in left leg: Secondary | ICD-10-CM

## 2021-12-01 NOTE — Progress Notes (Signed)
Subjective:    Patient ID: Catherine Chapman, female    DOB: 07/18/1960, 61 y.o.   MRN: [id number]  HPI She has had left sided lower back and left hip pain for several weeks.  She denies any trauma.   She has no weakness.  She has no numbness.  She was seen at [facility] on 11-23-21 and given a prednisone dose pack.  But it was sent to the [facility] here instead of her pharmacy in [place].  Thus, she has not been on it.  She has pain sitting a while and on first standing.  She has pain after walking a while. She has no bowel or bladder problems.  Review of Systems  Constitutional:  Positive for activity change.  Musculoskeletal:  Positive for arthralgias and back pain.  All other systems reviewed and are negative. For Review of Systems, all other systems reviewed and are negative.  The following is a summary of the past history medically, past history surgically, known current medicines, social history and family history.  This information is gathered electronically by the computer from prior information and documentation.  I review this each visit and have found including this information at this point in the chart is beneficial and informative.   Past Medical History:  Diagnosis Date   Aortic atherosclerosis (HCC)    Back pain    Diabetes (HCC)    Mediastinal lymphadenopathy 03/06/2021   Chest CT at [facility]   Pulmonary nodule 03/06/2021   Chest CT at [facility]    Past Surgical History:  Procedure Laterality Date   I & D EXTREMITY Left 01/17/2017   Procedure: IRRIGATION AND DEBRIBEMENT WITH OPEN REDUCTION INTERNAL FIXATION LEFT INDEX FINGER WITH EXTENSOR TENDON REPAIR;  Surgeon: Dairl Ponder, MD;  Location: [facility] OR;  Service: Orthopedics;  Laterality: Left;   KNEE SURGERY     KNEE SURGERY     ROTATOR CUFF REPAIR      Current Outpatient Medications on File Prior to Visit  Medication Sig Dispense Refill   aspirin 81 MG EC tablet Take 1 tablet (81 mg total) by  mouth daily. Swallow whole. 30 tablet 12   atorvastatin (LIPITOR) 10 MG tablet Take 1 tablet (10 mg total) by mouth daily. 90 tablet 1   Dulaglutide (TRULICITY) 0.75 MG/0.5ML SOPN Inject 0.75 mg into the skin once a week. DX: E11.65 2 mL 2   enalapril (VASOTEC) 2.5 MG tablet Take 1 tablet (2.5 mg total) by mouth daily. 90 tablet 1   insulin glargine (LANTUS) 100 UNIT/ML injection Inject 15 Units into the skin daily.     Continuous Blood Gluc Sensor (FREESTYLE LIBRE 3 SENSOR) MISC 1 Device by Does not apply route every 14 (fourteen) days. Place 1 sensor on the skin every 14 days. Use to check glucose continuously (Patient not taking: Reported on 12/01/2021) 2 each 2   Insulin Pen Needle 32G X 4 MM MISC Use with insulin daily Dx E11.69 (Patient not taking: Reported on 12/01/2021) 100 each 3   polyethylene glycol powder (GLYCOLAX/MIRALAX) 17 GM/SCOOP powder Take 17 g by mouth daily. (Patient not taking: Reported on 12/01/2021) 3350 g 1   predniSONE (STERAPRED UNI-PAK 21 TAB) 10 MG (21) TBPK tablet As directed x 6 days (Patient not taking: Reported on 12/01/2021) 21 tablet 0   No current facility-administered medications on file prior to visit.    Social History   Socioeconomic History   Marital status: Single    Spouse name: Not on file  Number of children: Not on file   Years of education: Not on file   Highest education level: Not on file  Occupational History   Not on file  Tobacco Use   Smoking status: Former    Types: Cigarettes    Quit date: 41    Years since quitting: 33.8   Smokeless tobacco: Never  Vaping Use   Vaping Use: Never used  Substance and Sexual Activity   Alcohol use: Yes    Comment: occ   Drug use: No   Sexual activity: Yes    Birth control/protection: None  Other Topics Concern   Not on file  Social History Narrative   Not on file   Social Determinants of Health   Financial Resource Strain: Not on file  Food Insecurity: Not on file  Transportation  Needs: Not on file  Physical Activity: Not on file  Stress: Not on file  Social Connections: Not on file  Intimate Partner Violence: Not on file    Family History  Problem Relation Age of Onset   Diabetes Mother    Stroke Mother    Diabetes Father    Stroke Father    Diabetes Sister    Lupus Daughter    Diabetes Maternal Grandmother    Stomach cancer Maternal Grandfather    Hypertension Paternal Grandmother    Diabetes Sister     BP 124/65   Pulse 76   Ht 5\' 8"  (1.727 m)   Wt 193 lb (87.5 kg)   BMI 29.35 kg/m   Body mass index is 29.35 kg/m.      Objective:   Physical Exam Vitals and nursing note reviewed. Exam conducted with a chaperone present.  Constitutional:      Appearance: She is well-developed.  HENT:     Head: Normocephalic and atraumatic.  Eyes:     Conjunctiva/sclera: Conjunctivae normal.     Pupils: Pupils are equal, round, and reactive to light.  Cardiovascular:     Rate and Rhythm: Normal rate and regular rhythm.  Pulmonary:     Effort: Pulmonary effort is normal.  Abdominal:     Palpations: Abdomen is soft.  Musculoskeletal:       Arms:     Cervical back: Normal range of motion and neck supple.  Skin:    General: Skin is warm and dry.  Neurological:     Mental Status: She is alert and oriented to person, place, and time.     Cranial Nerves: No cranial nerve deficit.     Motor: No abnormal muscle tone.     Coordination: Coordination normal.     Deep Tendon Reflexes: Reflexes are normal and symmetric. Reflexes normal.  Psychiatric:        Behavior: Behavior normal.        Thought Content: Thought content normal.        Judgment: Judgment normal.   I have independently reviewed and interpreted x-rays of this patient done at another site by another physician or qualified health professional.  I have reviewed her notes from .        Assessment & Plan:   Encounter Diagnosis  Name Primary?   Lumbar pain with  radiation down left leg Yes   We called the [facility] here and they have her Rx for the prednisone dose pack.  I would like for her to take it   Return in one week and re-evaluate.  Call if any problem.  Precautions discussed.  Electronically Signed  Darreld Mclean, MD 11/9/202311:03 AM

## 2021-12-01 NOTE — Patient Instructions (Addendum)
Dr.Keeling is here all day on Tuesdays, Wednesday mornings, and Thursday mornings. If you need anything such as a medication refill, please either call BEFORE the end of the day on [facility] or send a message through [place]. Your pharmacy can send a refill request for you. Calling by the end of the day on [facility] allows [place] time to send Dr.Keeling the request and for him to respond before he leaves on Thursdays.  If Dr. Hilda Lias is out of the office, we may send it to one of the other providers and they may not refill it for the same amount that your original prescription is for.   MY NAME IS ABBY AND I AM DR.KEELING'S ASSISTANT. IF YOU NEED ANYTHING, PLEASE DO NOT HESITATE TO EITHER SEND ME A MESSAGE VIA MYCHART OR CALL THE OFFICE [phone] AND LEAVE A MESSAGE FOR ME. I WILL RESPOND WITHIN 24-48 BUSINESS HOURS.   As the weather changes and gets cooler, you may notice you are affected more. You may have more pain in your joints. This is normal. Dress warmly and make sure that area is covered well.   TAKE THE PREDNISONE YOUR PCP PRESCRIBED. WE WILL SEE YOU IN A WEEK

## 2021-12-08 ENCOUNTER — Ambulatory Visit (INDEPENDENT_AMBULATORY_CARE_PROVIDER_SITE_OTHER): Payer: BC Managed Care – PPO | Admitting: Orthopaedic Surgery

## 2021-12-08 ENCOUNTER — Encounter: Payer: Self-pay | Admitting: Orthopaedic Surgery

## 2021-12-08 VITALS — Ht 68.0 in | Wt 193.0 lb

## 2021-12-08 DIAGNOSIS — M545 Low back pain, unspecified: Secondary | ICD-10-CM

## 2021-12-08 DIAGNOSIS — M79605 Pain in left leg: Secondary | ICD-10-CM | POA: Diagnosis not present

## 2021-12-08 MED ORDER — HYDROCODONE-ACETAMINOPHEN 5-325 MG PO TABS: ORAL_TABLET | ORAL | 0 refills | Status: DC

## 2021-12-08 MED ORDER — NAPROXEN 500 MG PO TABS: 500.0000 mg | ORAL_TABLET | Freq: Two times a day (BID) | ORAL | 5 refills | Status: DC

## 2021-12-08 NOTE — Progress Notes (Signed)
I am hurting again.  She took the prednisone dose pack and got better for a few days but the pain is back.  The left leg paresthesias have returned.  She is out of work.  She has no weakness, no spasm, no new trauma.  Spine/Pelvis examination:  Inspection:  Overall, sacoiliac joint benign and hips nontender; without crepitus or defects.   Thoracic spine inspection: Alignment normal without kyphosis present   Lumbar spine inspection:  Alignment  with normal lumbar lordosis, without scoliosis apparent.   Thoracic spine palpation:  without tenderness of spinal processes   Lumbar spine palpation: without tenderness of lumbar area; without tightness of lumbar muscles    Range of Motion:   Lumbar flexion, forward flexion is normal without pain or tenderness    Lumbar extension is full without pain or tenderness   Left lateral bend is normal without pain or tenderness   Right lateral bend is normal without pain or tenderness   Straight leg raising is normal  Strength & tone: normal   Stability overall normal stability  Encounter Diagnosis  Name Primary?   Lumbar pain with radiation down left leg Yes   I will begin Naprosyn and pain medicine.  I have reviewed the [place] Controlled Substance Reporting System web site prior to prescribing narcotic medicine for this patient.  She may need MRI.  Stay out of work.  Return in two weeks.  Call if any problem.  Precautions discussed.  Electronically Signed Darreld Mclean, MD 11/16/202310:02 AM

## 2021-12-08 NOTE — Patient Instructions (Addendum)
OOW NOTE: UNTIL NEXT VISIT  TAKE THE MEDICATIONS PRESCRIBED BY DR.KEELING TODAY AS DIRECTED. THE NAPROXEN MUST BE TAKEN WITH FOOD. IT TAKES 10-14 DAYS TO BUILD UP A STEADY STATE IN YOUR BLOOD.   DON'T TAKE THE NARCOTIC AND DRIVE OR TRY TO DO A LOT FOR THANKSGIVING.  As the weather changes and gets cooler, you may notice you are affected more. You may have more pain in your joints. This is normal. Dress warmly and make sure that area is covered well.   Dr.Keeling is here all day on Tuesdays, Wednesday mornings, and Thursday mornings. If you need anything such as a medication refill, please either call BEFORE the end of the day on [facility] or send a message through [place]. Your pharmacy can send a refill request for you. Calling by the end of the day on [facility] allows [place] time to send Dr.Keeling the request and for him to respond before he leaves on Thursdays.  If Dr. Hilda Lias is out of the office, we may send it to one of the other providers and they may not refill it for the same amount that your original prescription is for.  MY NAME IS ABBY AND I AM DR.KEELING'S ASSISTANT. IF YOU NEED ANYTHING, PLEASE DO NOT HESITATE TO EITHER SEND ME A MESSAGE VIA MYCHART OR CALL THE OFFICE [phone] AND LEAVE A MESSAGE FOR ME. I WILL RESPOND WITHIN 24-48 BUSINESS HOURS.

## 2021-12-22 ENCOUNTER — Encounter: Payer: Self-pay | Admitting: Orthopaedic Surgery

## 2021-12-22 ENCOUNTER — Ambulatory Visit (INDEPENDENT_AMBULATORY_CARE_PROVIDER_SITE_OTHER): Payer: BC Managed Care – PPO | Admitting: Orthopaedic Surgery

## 2021-12-22 VITALS — BP 138/82 | HR 95 | Ht 68.0 in | Wt 195.0 lb

## 2021-12-22 DIAGNOSIS — M79605 Pain in left leg: Secondary | ICD-10-CM | POA: Diagnosis not present

## 2021-12-22 DIAGNOSIS — M545 Low back pain, unspecified: Secondary | ICD-10-CM

## 2021-12-22 NOTE — Patient Instructions (Signed)
Stay out of work.  Get MRI of lumbar spine.

## 2021-12-22 NOTE — Progress Notes (Signed)
I am worse.  She has more lower back pain and paresthesias to the left lower leg from the back and now some right sided paresthesias.  She has been out of work.  She has taken the Naprosyn and done the exercises with little help.  I will get MRI of the lumbar spine.  I am concerned about HNP.  Lower back is tender, no spasm.  ROM decreased.  NV intact.  SLR weakly positive left at 30 degrees.  Gait is good.  Muscle tone and strength normal.  Encounter Diagnosis  Name Primary?   Lumbar pain with radiation down left leg Yes   To get MRI.  Out of work.  Return in two weeks.  Continue Naprosyn.  Call if any problem.  Precautions discussed.  Electronically Signed Darreld Mclean, MD 11/30/202310:00 AM

## 2021-12-22 NOTE — Addendum Note (Signed)
Addended by: Recardo Evangelist A on: 12/22/2021 12:51 PM   Modules accepted: Orders

## 2021-12-27 NOTE — Progress Notes (Signed)
[facility] Quality Team Note  Name: EILEEN KANGAS Date of Birth: [birth year] MRN: [id number] Date: 12/27/2021  [facility] Quality Team has reviewed this patient's chart, please see recommendations below:  Diabetic Retinal Eye Exam; Patient requests [facility] Quality Coordinator to schedule Diabetic Retinal Screening at [facility] Event 01/05/2022, 9:30am).

## 2022-01-03 ENCOUNTER — Telehealth: Payer: Self-pay | Admitting: Radiology

## 2022-01-03 DIAGNOSIS — M79605 Pain in left leg: Secondary | ICD-10-CM

## 2022-01-03 NOTE — Telephone Encounter (Signed)
Medicaid has denied coverage of the MRI until after physical therapy  Would Dr Hilda Lias  like to try a peer to peer or proceed with ordering physical therapy at this time?  I will call patient to advise once you let me know which he prefers

## 2022-01-04 ENCOUNTER — Telehealth: Payer: Self-pay | Admitting: Orthopaedic Surgery

## 2022-01-04 NOTE — Telephone Encounter (Signed)
Patient returned Amy's call.  The patient stated that she doesn't know anything about therapy.  She would like a call back tomorrow explaining.  Pt's # [phone]

## 2022-01-04 NOTE — Addendum Note (Signed)
Addended byCaffie Damme on: 01/04/2022 08:54 AM   Modules accepted: Orders

## 2022-01-04 NOTE — Telephone Encounter (Signed)
Left message for patient, she will need to go for therapy as per [facility] before we can get the MRI approved for her  Sent order to [facility] outpatient therapy to call her for the physical therapy

## 2022-01-05 NOTE — Telephone Encounter (Signed)
I called her again, the MRI did not get approved by Medicaid she has to go to physical therapy / I left another message for her  To you FYI in case she gets you. I am having hard time getting her, she works a lot.

## 2022-01-05 NOTE — Telephone Encounter (Signed)
Patient lvm during lunch, returning your call.  Phone # [phone]

## 2022-01-09 ENCOUNTER — Telehealth: Payer: Self-pay

## 2022-01-09 NOTE — Telephone Encounter (Signed)
Patient left message asking if our office had received paperwork from her short term disability for an update. I called her back and told her that I would send you a message to see if you have received any. She stated that her disability rep told her that Dr. Hilda Lias needed to answer questions and sign it before sending it back.

## 2022-01-10 ENCOUNTER — Ambulatory Visit (HOSPITAL_COMMUNITY): Payer: BC Managed Care – PPO

## 2022-01-11 ENCOUNTER — Other Ambulatory Visit: Payer: Self-pay

## 2022-01-11 ENCOUNTER — Ambulatory Visit: Payer: BC Managed Care – PPO | Attending: Orthopaedic Surgery

## 2022-01-11 DIAGNOSIS — M5416 Radiculopathy, lumbar region: Secondary | ICD-10-CM | POA: Diagnosis present

## 2022-01-11 DIAGNOSIS — M545 Low back pain, unspecified: Secondary | ICD-10-CM | POA: Insufficient documentation

## 2022-01-11 DIAGNOSIS — M79605 Pain in left leg: Secondary | ICD-10-CM | POA: Diagnosis not present

## 2022-01-11 DIAGNOSIS — M6281 Muscle weakness (generalized): Secondary | ICD-10-CM | POA: Diagnosis present

## 2022-01-11 NOTE — Therapy (Signed)
OUTPATIENT PHYSICAL THERAPY THORACOLUMBAR EVALUATION   Patient Name: Catherine Chapman MRN: [id number] DOB:1960-12-19, 61 y.o., female Today's Date: 01/11/2022  END OF SESSION:  PT End of Session - 01/11/22 0954     Visit Number 1    Number of Visits 12    Date for PT Re-Evaluation 03/17/22    PT Start Time 0955    PT Stop Time 1030    PT Time Calculation (min) 35 min    Activity Tolerance Patient limited by pain    Behavior During Therapy [facility] for tasks assessed/performed             Past Medical History:  Diagnosis Date   Aortic atherosclerosis ([place])    Back pain    Diabetes ([place])    Mediastinal lymphadenopathy 03/06/2021   Chest CT at [facility]   Pulmonary nodule 03/06/2021   Chest CT at [facility]   Past Surgical History:  Procedure Laterality Date   I & D EXTREMITY Left 01/17/2017   Procedure: IRRIGATION AND DEBRIBEMENT WITH OPEN REDUCTION INTERNAL FIXATION LEFT INDEX FINGER WITH EXTENSOR TENDON REPAIR;  Surgeon: Charlotte Crumb, MD;  Location: [place];  Service: Orthopedics;  Laterality: Left;   KNEE SURGERY     KNEE SURGERY     ROTATOR CUFF REPAIR     Patient Active Problem List   Diagnosis Date Noted   Hyperlipidemia associated with type 2 diabetes mellitus ([place]) 11/11/2021   Aortic atherosclerosis ([place]) 03/14/2021   Mediastinal lymphadenopathy 03/14/2021   Pulmonary nodule 03/14/2021   Type 2 diabetes mellitus with hyperglycemia, without long-term current use of insulin ([place]) 12/25/2019    PCP: Baruch Gouty, FNP  REFERRING PROVIDER: Sanjuana Kava, MD   REFERRING DIAG: Lumbar pain with radiation down left leg   Rationale for Evaluation and Treatment: Rehabilitation  THERAPY DIAG:  Radiculopathy, lumbar region  Muscle weakness (generalized)  ONSET DATE: October 2023  SUBJECTIVE:                                                                                                                                                                                            SUBJECTIVE STATEMENT: Patient reports that she woke up one day in September or October and could hardly get up. She notes that her pain was so bad that she had to leave work early. She then went back to bed and had to have help to get up. She feels that her left hip getting better, but she is now having pain down her right leg. She had a previous injury to her low back, but this had not bothered her too much until  recently.   PERTINENT HISTORY:  Diabetes  PAIN:  Are you having pain? Yes: NPRS scale: 7-8/10 Pain location: low back and right leg Pain description: pinching, pressure, intermittent Aggravating factors: lifting, pulling, twisting, quick movements, standing (10-15 minutes), sweeping, cleaning  Relieving factors: medication and sleep   PRECAUTIONS: None  WEIGHT BEARING RESTRICTIONS: No  FALLS:  Has patient fallen in last 6 months? No  LIVING ENVIRONMENT: Lives with: lives with their family Lives in: House/apartment Stairs: Yes: External: 3 steps; can reach both; step to pattern Has following equipment at home: None  OCCUPATION: Physiological scientist; she has to a lot of pushing, pulling, and twisting at work  PLOF: Independent  PATIENT GOALS: reduced pain, improved strength, be able to stand and be active longer  NEXT MD VISIT: 02/23/22  OBJECTIVE:   DIAGNOSTIC FINDINGS: 11/15/21 lumbar x-ray IMPRESSION: Mild lower lumbar spondylosis. No acute findings.  PATIENT SURVEYS:  FOTO 25.98  SCREENING FOR RED FLAGS: Bowel or bladder incontinence: No Spinal tumors: No Cauda equina syndrome: No Compression fracture: No Abdominal aneurysm: No  COGNITION: Overall cognitive status: Within functional limits for tasks assessed     SENSATION: Patient reports no numbness or tingling  POSTURE: rounded shoulders and forward head  PALPATION: TTP: bilateral lumbar paraspinals (significantly reproduce familiar low back and referred pain), QL  (significantly reproduce familiar low back and referred pain), gluteals (significantly reproduce familiar low back and referred pain), piriformis (significantly reproduce familiar low back and referred pain), IT band, and TFL   LUMBAR ROM:   AROM eval  Flexion 48; limited by pain  Extension 14; limited by pain  Right lateral flexion 25% limited; slight pain  Left lateral flexion 25% limited; slight pain  Right rotation 50% limited; limited by pain  Left rotation 50% limited; limited by pain   (Blank rows = not tested)  LOWER EXTREMITY ROM: WFL for activities assessed  LOWER EXTREMITY MMT:    MMT Right eval Left eval  Hip flexion 3/5; groin pain 3+/5; groin pain  Hip extension    Hip abduction    Hip adduction    Hip internal rotation    Hip external rotation    Knee flexion 3+/5 4-/5  Knee extension 3/5; shooting pain into LB (R>L) 3/5; shooting pain into LB (R>L)  Ankle dorsiflexion 3/5 3/5  Ankle plantarflexion    Ankle inversion    Ankle eversion     (Blank rows = not tested)  GAIT: Assistive device utilized: None Level of assistance: Complete Independence Comments: Decreased gait speed and stride length with a wide base of support and increased lateral sway  TODAY'S TREATMENT:                                                                                                                              DATE:     PATIENT EDUCATION:  Education details: Plan of care, prognosis, referred pain, and goals for therapy Person educated: Patient Education method:  Explanation Education comprehension: verbalized understanding  HOME EXERCISE PROGRAM:   ASSESSMENT:  CLINICAL IMPRESSION: Patient is a 61 y.o. female who was seen today for physical therapy evaluation and treatment for right lumbar radiculopathy.  She presented with high pain severity and irritability with palpation to her lumbar spine and the surrounding musculature reproducing her familiar referred pain.   Recommend that she continue with skilled physical therapy to address her impairments to maximize her safety and functional mobility.  OBJECTIVE IMPAIRMENTS: Abnormal gait, decreased activity tolerance, decreased mobility, difficulty walking, decreased ROM, decreased strength, hypomobility, impaired flexibility, impaired tone, postural dysfunction, and pain.   ACTIVITY LIMITATIONS: carrying, lifting, bending, sitting, standing, squatting, stairs, transfers, bed mobility, dressing, and locomotion level  PARTICIPATION LIMITATIONS: meal prep, cleaning, laundry, driving, shopping, community activity, occupation, and yard work  PERSONAL FACTORS: Time since onset of injury/illness/exacerbation and 1 comorbidity: Diabetes  are also affecting patient's functional outcome.   REHAB POTENTIAL: Fair    CLINICAL DECISION MAKING: Evolving/moderate complexity  EVALUATION COMPLEXITY: Moderate   GOALS: Goals reviewed with patient? Yes  SHORT TERM GOALS: Target date: 02/01/22  Patient will be independent with her initial HEP. Baseline: Goal status: INITIAL  2.  Patient will be able to complete her daily activities without her familiar low back and referred pain exceeding 5/10. Baseline:  Goal status: INITIAL  3.  Patient will be able to stand for at least 20 minutes prior to an increase in her familiar low back pain. Baseline:  Goal status: INITIAL  LONG TERM GOALS: Target date: 02/22/22  Patient will be independent with her advanced HEP.  Baseline:  Goal status: INITIAL  2.  Patient will be able to navigate at least 4 steps with a reciprocal gait pattern for improved household mobility. Baseline:  Goal status: INITIAL  3.  Patient will be able to stand and walk for at least 30 minutes prior to her increase in her familiar low back pain for improved function with her daily activities. Baseline:  Goal status: INITIAL  4.  Patient be able to complete her daily activities without her familiar  low back pain exceeding 3/10. Baseline:  Goal status: INITIAL  PLAN:  PT FREQUENCY: 1-2x/week  PT DURATION: 6 weeks  PLANNED INTERVENTIONS: Therapeutic exercises, Therapeutic activity, Neuromuscular re-education, Balance training, Gait training, Patient/Family education, Self Care, Joint mobilization, Stair training, Dry Needling, Electrical stimulation, Spinal mobilization, Cryotherapy, Moist heat, Traction, Ultrasound, Manual therapy, and Re-evaluation.  PLAN FOR NEXT SESSION: NuStep, lumbar stability, lower extremity strengthening, manual therapy as tolerated, and modalities as needed   Darlin Coco, PT 01/11/2022, 2:52 PM

## 2022-01-12 ENCOUNTER — Ambulatory Visit: Payer: BC Managed Care – PPO | Admitting: Orthopaedic Surgery

## 2022-01-13 NOTE — Telephone Encounter (Signed)
Spoke w/the patient, she is going to bring the paper by the office on Wednesday, 01/18/22.

## 2022-01-19 ENCOUNTER — Ambulatory Visit: Payer: BC Managed Care – PPO

## 2022-01-24 ENCOUNTER — Encounter: Payer: Self-pay | Admitting: Physical Therapy

## 2022-01-24 ENCOUNTER — Ambulatory Visit: Payer: BC Managed Care – PPO | Attending: Orthopaedic Surgery | Admitting: Physical Therapy

## 2022-01-24 DIAGNOSIS — M6281 Muscle weakness (generalized): Secondary | ICD-10-CM | POA: Insufficient documentation

## 2022-01-24 DIAGNOSIS — M5416 Radiculopathy, lumbar region: Secondary | ICD-10-CM | POA: Insufficient documentation

## 2022-01-24 NOTE — Patient Instructions (Signed)

## 2022-01-24 NOTE — Therapy (Signed)
OUTPATIENT PHYSICAL THERAPY THORACOLUMBAR EVALUATION   Patient Name: Catherine Chapman MRN: [id number] DOB:03-21-1960, 62 y.o., female Today's Date: 01/24/2022  END OF SESSION:  PT End of Session - 01/24/22 0902     Visit Number 2    Number of Visits 12    Date for PT Re-Evaluation 03/17/22    PT Start Time 0902    PT Stop Time 0949    PT Time Calculation (min) 47 min    Activity Tolerance Patient limited by pain    Behavior During Therapy [facility] for tasks assessed/performed            Past Medical History:  Diagnosis Date   Aortic atherosclerosis ([place])    Back pain    Diabetes ([place])    Mediastinal lymphadenopathy 03/06/2021   Chest CT at [facility]   Pulmonary nodule 03/06/2021   Chest CT at [facility]   Past Surgical History:  Procedure Laterality Date   I & D EXTREMITY Left 01/17/2017   Procedure: IRRIGATION AND DEBRIBEMENT WITH OPEN REDUCTION INTERNAL FIXATION LEFT INDEX FINGER WITH EXTENSOR TENDON REPAIR;  Surgeon: Charlotte Crumb, MD;  Location: [place];  Service: Orthopedics;  Laterality: Left;   KNEE SURGERY     KNEE SURGERY     ROTATOR CUFF REPAIR     Patient Active Problem List   Diagnosis Date Noted   Hyperlipidemia associated with type 2 diabetes mellitus ([place]) 11/11/2021   Aortic atherosclerosis ([place]) 03/14/2021   Mediastinal lymphadenopathy 03/14/2021   Pulmonary nodule 03/14/2021   Type 2 diabetes mellitus with hyperglycemia, without long-term current use of insulin ([place]) 12/25/2019    PCP: Baruch Gouty, FNP  REFERRING PROVIDER: Sanjuana Kava, MD   REFERRING DIAG: Lumbar pain with radiation down left leg   Rationale for Evaluation and Treatment: Rehabilitation  THERAPY DIAG:  Radiculopathy, lumbar region  Muscle weakness (generalized)  ONSET DATE: October 2023  SUBJECTIVE:                                                                                                                                                                                            SUBJECTIVE STATEMENT: Reports tightness in low back this morning.  PERTINENT HISTORY:  Diabetes  PAIN:  Are you having pain? Yes: NPRS scale: 7/10 Pain location: low back and right leg Pain description: pinching, pressure, intermittent Aggravating factors: lifting, pulling, twisting, quick movements, standing (10-15 minutes), sweeping, cleaning  Relieving factors: medication and sleep   PRECAUTIONS: None  PATIENT GOALS: reduced pain, improved strength, be able to stand and be active longer  NEXT MD VISIT: 02/23/22  OBJECTIVE:   DIAGNOSTIC FINDINGS:  11/15/21 lumbar x-ray IMPRESSION: Mild lower lumbar spondylosis. No acute findings.  PATIENT SURVEYS:  FOTO 25.98  LUMBAR ROM:   AROM eval  Flexion 48; limited by pain  Extension 14; limited by pain  Right lateral flexion 25% limited; slight pain  Left lateral flexion 25% limited; slight pain  Right rotation 50% limited; limited by pain  Left rotation 50% limited; limited by pain   (Blank rows = not tested)  LOWER EXTREMITY ROM: WFL for activities assessed  LOWER EXTREMITY MMT:    MMT Right eval Left eval  Hip flexion 3/5; groin pain 3+/5; groin pain  Hip extension    Hip abduction    Hip adduction    Hip internal rotation    Hip external rotation    Knee flexion 3+/5 4-/5  Knee extension 3/5; shooting pain into LB (R>L) 3/5; shooting pain into LB (R>L)  Ankle dorsiflexion 3/5 3/5  Ankle plantarflexion    Ankle inversion    Ankle eversion     (Blank rows = not tested) TODAY'S TREATMENT:                                                                                                                              DATE:  01/24/22                                    EXERCISE LOG  Exercise Repetitions and Resistance Comments  Nustep L4 x16 min   LTR 5x10 sec   SKTC BLE 4x15 sec   Bridge X15 reps   SL clam X15 reps    Blank cell = exercise not performed today   Modalities  Date:   Unattended Estim: Lumbar, IFC, 10 mins, Pain  PATIENT EDUCATION:  Education details: Scientific laboratory technician Person educated: Patient Education method: Explanation, handout Education comprehension: verbalized understanding  HOME EXERCISE PROGRAM:  ASSESSMENT:  CLINICAL IMPRESSION:  Patient presented in clinic with reports of increased LBP but her job requires a lot of rotation like activity. Patient progressed through light stretching and strengthening today with reports of some discomfort throughout therex session. Patient provided new handout for posture/ADLs with instruction and patient verbalizing understanding. Normal stimulation response noted following removal of the modality.  OBJECTIVE IMPAIRMENTS: Abnormal gait, decreased activity tolerance, decreased mobility, difficulty walking, decreased ROM, decreased strength, hypomobility, impaired flexibility, impaired tone, postural dysfunction, and pain.   ACTIVITY LIMITATIONS: carrying, lifting, bending, sitting, standing, squatting, stairs, transfers, bed mobility, dressing, and locomotion level  PARTICIPATION LIMITATIONS: meal prep, cleaning, laundry, driving, shopping, community activity, occupation, and yard work  PERSONAL FACTORS: Time since onset of injury/illness/exacerbation and 1 comorbidity: Diabetes  are also affecting patient's functional outcome.   REHAB POTENTIAL: Fair    CLINICAL DECISION MAKING: Evolving/moderate complexity  EVALUATION COMPLEXITY: Moderate  GOALS: Goals reviewed with patient? Yes  SHORT TERM GOALS: Target date: 02/01/22  Patient will be independent  with her initial HEP. Baseline: Goal status: INITIAL  2.  Patient will be able to complete her daily activities without her familiar low back and referred pain exceeding 5/10. Baseline:  Goal status: INITIAL  3.  Patient will be able to stand for at least 20 minutes prior to an increase in her familiar low back pain. Baseline:  Goal status:  INITIAL  LONG TERM GOALS: Target date: 02/22/22  Patient will be independent with her advanced HEP.  Baseline:  Goal status: INITIAL  2.  Patient will be able to navigate at least 4 steps with a reciprocal gait pattern for improved household mobility. Baseline:  Goal status: INITIAL  3.  Patient will be able to stand and walk for at least 30 minutes prior to her increase in her familiar low back pain for improved function with her daily activities. Baseline:  Goal status: INITIAL  4.  Patient be able to complete her daily activities without her familiar low back pain exceeding 3/10. Baseline:  Goal status: INITIAL  PLAN:  PT FREQUENCY: 1-2x/week  PT DURATION: 6 weeks  PLANNED INTERVENTIONS: Therapeutic exercises, Therapeutic activity, Neuromuscular re-education, Balance training, Gait training, Patient/Family education, Self Care, Joint mobilization, Stair training, Dry Needling, Electrical stimulation, Spinal mobilization, Cryotherapy, Moist heat, Traction, Ultrasound, Manual therapy, and Re-evaluation.  PLAN FOR NEXT SESSION: NuStep, lumbar stability, lower extremity strengthening, manual therapy as tolerated, and modalities as needed  Standley Brooking, PTA 01/24/2022, 9:57 AM

## 2022-01-27 ENCOUNTER — Telehealth: Payer: Self-pay | Admitting: Radiology

## 2022-01-27 NOTE — Telephone Encounter (Signed)
Patient called, LMVM asking about medical records that need to go to [facility].  Please call her.

## 2022-01-30 NOTE — Telephone Encounter (Signed)
Notes and info sent again, I called patient and advised.

## 2022-02-03 ENCOUNTER — Encounter: Payer: Self-pay | Admitting: Physical Therapy

## 2022-02-03 ENCOUNTER — Ambulatory Visit: Payer: BC Managed Care – PPO | Admitting: Physical Therapy

## 2022-02-03 DIAGNOSIS — M6281 Muscle weakness (generalized): Secondary | ICD-10-CM

## 2022-02-03 DIAGNOSIS — M5416 Radiculopathy, lumbar region: Secondary | ICD-10-CM

## 2022-02-03 NOTE — Therapy (Signed)
OUTPATIENT PHYSICAL THERAPY THORACOLUMBAR TREATMENT  Patient Name: Catherine Chapman MRN: [id number] DOB:July 03, 1960, 62 y.o., female Today's Date: 02/03/2022  END OF SESSION:  PT End of Session - 02/03/22 0948     Visit Number 3    Number of Visits 12    Date for PT Re-Evaluation 03/17/22    PT Start Time 0947    PT Stop Time 1025    PT Time Calculation (min) 38 min    Activity Tolerance Patient limited by pain    Behavior During Therapy [facility] for tasks assessed/performed            Past Medical History:  Diagnosis Date   Aortic atherosclerosis ([place])    Back pain    Diabetes ([place])    Mediastinal lymphadenopathy 03/06/2021   Chest CT at [facility]   Pulmonary nodule 03/06/2021   [facility] CT at [facility]   Past Surgical History:  Procedure Laterality Date   I & D EXTREMITY Left 01/17/2017   Procedure: IRRIGATION AND DEBRIBEMENT WITH OPEN REDUCTION INTERNAL FIXATION LEFT INDEX FINGER WITH EXTENSOR TENDON REPAIR;  Surgeon: Charlotte Crumb, MD;  Location: [place];  Service: Orthopedics;  Laterality: Left;   KNEE SURGERY     KNEE SURGERY     ROTATOR CUFF REPAIR     Patient Active Problem List   Diagnosis Date Noted   Hyperlipidemia associated with type 2 diabetes mellitus ([place]) 11/11/2021   Aortic atherosclerosis ([place]) 03/14/2021   Mediastinal lymphadenopathy 03/14/2021   Pulmonary nodule 03/14/2021   Type 2 diabetes mellitus with hyperglycemia, without long-term current use of insulin ([place]) 12/25/2019   PCP: Baruch Gouty, FNP  REFERRING PROVIDER: Sanjuana Kava, MD   REFERRING DIAG: Lumbar pain with radiation down left leg   Rationale for Evaluation and Treatment: Rehabilitation  THERAPY DIAG:  Radiculopathy, lumbar region  Muscle weakness (generalized)  ONSET DATE: October 2023  SUBJECTIVE:                                                                                                                                                                                            SUBJECTIVE STATEMENT: Reports she has been trying to accomplish ADLs but had some soreness since last treatment. Requested no electrical stimulation as it causes her to have a reaction.  PERTINENT HISTORY:  Diabetes  PAIN:  Are you having pain? Yes: NPRS scale: 6/10 Pain location: low back and right leg Pain description: pinching, pressure, intermittent Aggravating factors: lifting, pulling, twisting, quick movements, standing (10-15 minutes), sweeping, cleaning  Relieving factors: medication and sleep   PRECAUTIONS: None  PATIENT GOALS: reduced pain, improved strength, be  able to stand and be active longer  NEXT MD VISIT: 02/23/22  OBJECTIVE:   DIAGNOSTIC FINDINGS: 11/15/21 lumbar x-ray IMPRESSION: Mild lower lumbar spondylosis. No acute findings.  PATIENT SURVEYS:  FOTO 25.98  LUMBAR ROM:   AROM eval  Flexion 48; limited by pain  Extension 14; limited by pain  Right lateral flexion 25% limited; slight pain  Left lateral flexion 25% limited; slight pain  Right rotation 50% limited; limited by pain  Left rotation 50% limited; limited by pain   (Blank rows = not tested)  LOWER EXTREMITY ROM: WFL for activities assessed  LOWER EXTREMITY MMT:    MMT Right eval Left eval  Hip flexion 3/5; groin pain 3+/5; groin pain  Hip extension    Hip abduction    Hip adduction    Hip internal rotation    Hip external rotation    Knee flexion 3+/5 4-/5  Knee extension 3/5; shooting pain into LB (R>L) 3/5; shooting pain into LB (R>L)  Ankle dorsiflexion 3/5 3/5  Ankle plantarflexion    Ankle inversion    Ankle eversion     (Blank rows = not tested) TODAY'S TREATMENT:                                                                                                                              DATE:  02/03/22                                   EXERCISE LOG  Exercise Repetitions and Resistance Comments  Nustep L4 x15 min   Cabinet press for core X10 reps 5 sec    SKTC BLE 4x10 sec   LTR 5x15 sec   Lat pulldown Blue XTS x10 reps   Mod arm raise X10 reps 3 sec holds   Mod leg raise X10 reps 3 sec holds   Supine march X15 reps   Bridge X15 reps 3 sec   SL clam X15 reps    Blank cell = exercise not performed today   PATIENT EDUCATION:  Education details: Statistician Person educated: Patient Education method: Explanation, handout Education comprehension: verbalized understanding  HOME EXERCISE PROGRAM:  ASSESSMENT:  CLINICAL IMPRESSION:  Patient presented in clinic with reports of moderate LBP and soreness since last visit. Patient progressed through new postural and stability exercises with a discomfort reported but no greater pain score. Patient observed complete good log roll technique with supine <> SL exercises and to sit. No questions from patient regarding posture/ADLs handout. No modalities completed per patient request.  OBJECTIVE IMPAIRMENTS: Abnormal gait, decreased activity tolerance, decreased mobility, difficulty walking, decreased ROM, decreased strength, hypomobility, impaired flexibility, impaired tone, postural dysfunction, and pain.   ACTIVITY LIMITATIONS: carrying, lifting, bending, sitting, standing, squatting, stairs, transfers, bed mobility, dressing, and locomotion level  PARTICIPATION LIMITATIONS: meal prep, cleaning, laundry, driving, shopping, community activity, occupation, and yard work  PERSONAL FACTORS: Time since onset of injury/illness/exacerbation and 1 comorbidity: Diabetes  are also affecting patient's functional outcome.   REHAB POTENTIAL: Fair    CLINICAL DECISION MAKING: Evolving/moderate complexity  EVALUATION COMPLEXITY: Moderate  GOALS: Goals reviewed with patient? Yes  SHORT TERM GOALS: Target date: 02/01/22  Patient will be independent with her initial HEP. Baseline: Goal status: INITIAL  2.  Patient will be able to complete her daily activities without her familiar low back and  referred pain exceeding 5/10. Baseline:  Goal status: INITIAL  3.  Patient will be able to stand for at least 20 minutes prior to an increase in her familiar low back pain. Baseline:  Goal status: INITIAL  LONG TERM GOALS: Target date: 02/22/22  Patient will be independent with her advanced HEP.  Baseline:  Goal status: INITIAL  2.  Patient will be able to navigate at least 4 steps with a reciprocal gait pattern for improved household mobility. Baseline:  Goal status: INITIAL  3.  Patient will be able to stand and walk for at least 30 minutes prior to her increase in her familiar low back pain for improved function with her daily activities. Baseline:  Goal status: INITIAL  4.  Patient be able to complete her daily activities without her familiar low back pain exceeding 3/10. Baseline:  Goal status: INITIAL  PLAN:  PT FREQUENCY: 1-2x/week  PT DURATION: 6 weeks  PLANNED INTERVENTIONS: Therapeutic exercises, Therapeutic activity, Neuromuscular re-education, Balance training, Gait training, Patient/Family education, Self Care, Joint mobilization, Stair training, Dry Needling, Electrical stimulation, Spinal mobilization, Cryotherapy, Moist heat, Traction, Ultrasound, Manual therapy, and Re-evaluation.  PLAN FOR NEXT SESSION: NuStep, lumbar stability, lower extremity strengthening, manual therapy as tolerated, and modalities as needed  Marvell Fuller, PTA 02/03/2022, 10:26 AM

## 2022-02-10 ENCOUNTER — Ambulatory Visit: Payer: BC Managed Care – PPO | Admitting: Physical Therapy

## 2022-02-10 ENCOUNTER — Encounter: Payer: Self-pay | Admitting: Physical Therapy

## 2022-02-10 DIAGNOSIS — M5416 Radiculopathy, lumbar region: Secondary | ICD-10-CM

## 2022-02-10 DIAGNOSIS — M6281 Muscle weakness (generalized): Secondary | ICD-10-CM

## 2022-02-10 NOTE — Therapy (Signed)
OUTPATIENT PHYSICAL THERAPY THORACOLUMBAR TREATMENT  Patient Name: Catherine Chapman MRN: [id number] DOB:Dec 25, 1960, 62 y.o., female Today's Date: 02/10/2022  END OF SESSION:  PT End of Session - 02/10/22 1052     Visit Number 4    Number of Visits 12    Date for PT Re-Evaluation 03/17/22    PT Start Time 9563    PT Stop Time 1115    PT Time Calculation (min) 40 min    Activity Tolerance Patient limited by pain    Behavior During Therapy [facility] for tasks assessed/performed            Past Medical History:  Diagnosis Date   Aortic atherosclerosis ([place])    Back pain    Diabetes ([place])    Mediastinal lymphadenopathy 03/06/2021   [facility]   Pulmonary nodule 03/06/2021   [facility] CT at [facility]   Past Surgical History:  Procedure Laterality Date   I & D EXTREMITY Left 01/17/2017   Procedure: IRRIGATION AND DEBRIBEMENT WITH OPEN REDUCTION INTERNAL FIXATION LEFT INDEX FINGER WITH EXTENSOR TENDON REPAIR;  Surgeon: Charlotte Crumb, MD;  Location: [place];  Service: Orthopedics;  Laterality: Left;   KNEE SURGERY     KNEE SURGERY     ROTATOR CUFF REPAIR     Patient Active Problem List   Diagnosis Date Noted   Hyperlipidemia associated with type 2 diabetes mellitus ([place]) 11/11/2021   Aortic atherosclerosis ([place]) 03/14/2021   Mediastinal lymphadenopathy 03/14/2021   Pulmonary nodule 03/14/2021   Type 2 diabetes mellitus with hyperglycemia, without long-term current use of insulin ([place]) 12/25/2019   PCP: Baruch Gouty, FNP  REFERRING PROVIDER: Sanjuana Kava, MD   REFERRING DIAG: Lumbar pain with radiation down left leg   Rationale for Evaluation and Treatment: Rehabilitation  THERAPY DIAG:  Radiculopathy, lumbar region  Muscle weakness (generalized)  ONSET DATE: October 2023  SUBJECTIVE:                                                                                                                                                                                            SUBJECTIVE STATEMENT: Did good without stim unit last visit. Still having centralized LBP.  PERTINENT HISTORY:  Diabetes  PAIN:  Are you having pain? Yes: NPRS scale: 5/10 Pain location: low back and right leg Pain description: pinching, pressure, intermittent Aggravating factors: lifting, pulling, twisting, quick movements, standing (10-15 minutes), sweeping, cleaning  Relieving factors: medication and sleep   PRECAUTIONS: None  PATIENT GOALS: reduced pain, improved strength, be able to stand and be active longer  NEXT MD VISIT: 02/23/22  OBJECTIVE:  DIAGNOSTIC FINDINGS: 11/15/21 lumbar x-ray IMPRESSION: Mild lower lumbar spondylosis. No acute findings.  PATIENT SURVEYS:  FOTO 25.98  LUMBAR ROM:   AROM eval  Flexion 48; limited by pain  Extension 14; limited by pain  Right lateral flexion 25% limited; slight pain  Left lateral flexion 25% limited; slight pain  Right rotation 50% limited; limited by pain  Left rotation 50% limited; limited by pain   (Blank rows = not tested)  LOWER EXTREMITY ROM: WFL for activities assessed  LOWER EXTREMITY MMT:    MMT Right eval Left eval  Hip flexion 3/5; groin pain 3+/5; groin pain  Hip extension    Hip abduction    Hip adduction    Hip internal rotation    Hip external rotation    Knee flexion 3+/5 4-/5  Knee extension 3/5; shooting pain into LB (R>L) 3/5; shooting pain into LB (R>L)  Ankle dorsiflexion 3/5 3/5  Ankle plantarflexion    Ankle inversion    Ankle eversion     (Blank rows = not tested)  TODAY'S TREATMENT:                                                                                                                              DATE:  02/10/22                                   EXERCISE LOG  Exercise Repetitions and Resistance Comments  Nustep L4 x18 min   Cabinet press for core X10 reps 5 sec   SKTC BLE 3x20 sec   LTR 5x15 sec   Lat pulldown Blue XTS x15 reps   Shoulder row Blue  XTS x15 reps    Blank cell = exercise not performed today   Manual Therapy in prone for comfort and accessibility Soft Tissue Mobilization: B lumbar paraspinals, to reduce tightness reported during therex    PATIENT EDUCATION:  Education details: Posture/ADLs handout Person educated: Patient Education method: Explanation, handout Education comprehension: verbalized understanding  HOME EXERCISE PROGRAM:  ASSESSMENT:  CLINICAL IMPRESSION:  Patient presented in clinic with reports of continued LBP. With standing postural strengthening patient began reporting tightness of lumbar paraspinals. Mod multimodal cueing required for supine stretches to ensure proper technique. STW completed with patient in prone with one pillow under abdomen. Mod tightness palpable in B lumbar paraspinals and patient especially tender over PSIS region. No complaints upon end of treatment.  OBJECTIVE IMPAIRMENTS: Abnormal gait, decreased activity tolerance, decreased mobility, difficulty walking, decreased ROM, decreased strength, hypomobility, impaired flexibility, impaired tone, postural dysfunction, and pain.   ACTIVITY LIMITATIONS: carrying, lifting, bending, sitting, standing, squatting, stairs, transfers, bed mobility, dressing, and locomotion level  PARTICIPATION LIMITATIONS: meal prep, cleaning, laundry, driving, shopping, community activity, occupation, and yard work  PERSONAL FACTORS: Time since onset of injury/illness/exacerbation and 1 comorbidity: Diabetes  are also affecting patient's functional outcome.  REHAB POTENTIAL: Fair    CLINICAL DECISION MAKING: Evolving/moderate complexity  EVALUATION COMPLEXITY: Moderate  GOALS: Goals reviewed with patient? Yes  SHORT TERM GOALS: Target date: 02/01/22  Patient will be independent with her initial HEP. Baseline: Goal status: INITIAL  2.  Patient will be able to complete her daily activities without her familiar low back and referred pain  exceeding 5/10. Baseline:  Goal status: INITIAL  3.  Patient will be able to stand for at least 20 minutes prior to an increase in her familiar low back pain. Baseline:  Goal status: INITIAL  LONG TERM GOALS: Target date: 02/22/22  Patient will be independent with her advanced HEP.  Baseline:  Goal status: INITIAL  2.  Patient will be able to navigate at least 4 steps with a reciprocal gait pattern for improved household mobility. Baseline:  Goal status: INITIAL  3.  Patient will be able to stand and walk for at least 30 minutes prior to her increase in her familiar low back pain for improved function with her daily activities. Baseline:  Goal status: INITIAL  4.  Patient be able to complete her daily activities without her familiar low back pain exceeding 3/10. Baseline:  Goal status: INITIAL  PLAN:  PT FREQUENCY: 1-2x/week  PT DURATION: 6 weeks  PLANNED INTERVENTIONS: Therapeutic exercises, Therapeutic activity, Neuromuscular re-education, Balance training, Gait training, Patient/Family education, Self Care, Joint mobilization, Stair training, Dry Needling, Electrical stimulation, Spinal mobilization, Cryotherapy, Moist heat, Traction, Ultrasound, Manual therapy, and Re-evaluation.  PLAN FOR NEXT SESSION: NuStep, lumbar stability, lower extremity strengthening, manual therapy as tolerated, and modalities as needed  Standley Brooking, PTA 02/10/2022, 11:34 AM

## 2022-02-14 ENCOUNTER — Ambulatory Visit: Payer: BC Managed Care – PPO | Admitting: Family Medicine

## 2022-02-17 ENCOUNTER — Encounter: Payer: BC Managed Care – PPO | Admitting: *Deleted

## 2022-02-22 ENCOUNTER — Ambulatory Visit: Payer: BC Managed Care – PPO

## 2022-02-22 DIAGNOSIS — M5416 Radiculopathy, lumbar region: Secondary | ICD-10-CM | POA: Diagnosis not present

## 2022-02-22 DIAGNOSIS — M6281 Muscle weakness (generalized): Secondary | ICD-10-CM

## 2022-02-22 NOTE — Therapy (Addendum)
OUTPATIENT PHYSICAL THERAPY THORACOLUMBAR TREATMENT  Patient Name: Catherine Chapman MRN: [id number] DOB:12/09/1960, 62 y.o., female Today's Date: 02/22/2022  END OF SESSION:  PT End of Session - 02/22/22 1348     Visit Number 5    Number of Visits 12    Date for PT Re-Evaluation 03/17/22    PT Start Time 1345    PT Stop Time 1426    PT Time Calculation (min) 41 min    Activity Tolerance Patient limited by pain    Behavior During Therapy [facility] for tasks assessed/performed             Past Medical History:  Diagnosis Date   Aortic atherosclerosis (HCC)    Back pain    Diabetes (HCC)    Mediastinal lymphadenopathy 03/06/2021   Chest CT at [facility]   Pulmonary nodule 03/06/2021   Chest CT at [facility]   Past Surgical History:  Procedure Laterality Date   I & D EXTREMITY Left 01/17/2017   Procedure: IRRIGATION AND DEBRIBEMENT WITH OPEN REDUCTION INTERNAL FIXATION LEFT INDEX FINGER WITH EXTENSOR TENDON REPAIR;  Surgeon: Dairl Ponder, MD;  Location: [facility] OR;  Service: Orthopedics;  Laterality: Left;   KNEE SURGERY     KNEE SURGERY     ROTATOR CUFF REPAIR     Patient Active Problem List   Diagnosis Date Noted   Hyperlipidemia associated with type 2 diabetes mellitus (HCC) 11/11/2021   Aortic atherosclerosis (HCC) 03/14/2021   Mediastinal lymphadenopathy 03/14/2021   Pulmonary nodule 03/14/2021   Type 2 diabetes mellitus with hyperglycemia, without long-term current use of insulin (HCC) 12/25/2019   PCP: Sonny Masters, FNP  REFERRING PROVIDER: Darreld Mclean, MD   REFERRING DIAG: Lumbar pain with radiation down left leg   Rationale for Evaluation and Treatment: Rehabilitation  THERAPY DIAG:  Radiculopathy, lumbar region  Muscle weakness (generalized)  ONSET DATE: October 2023  SUBJECTIVE:                                                                                                                                                                                            SUBJECTIVE STATEMENT: Patient reports that she feels like therapy has helped some, but something is going on. She feels that her HEP is helping to reduce her pain, but she still has to be careful.   PERTINENT HISTORY:  Diabetes  PAIN:  Are you having pain? Yes: NPRS scale: 7/10 Pain location: low back and right leg Pain description: pinching, pressure, intermittent Aggravating factors: lifting, pulling, twisting, quick movements, standing (10-15 minutes), sweeping, cleaning  Relieving factors: medication and sleep   PRECAUTIONS: None  PATIENT GOALS: reduced pain, improved strength, be able to stand and be active longer  NEXT MD VISIT: 02/23/22  OBJECTIVE:   DIAGNOSTIC FINDINGS: 11/15/21 lumbar x-ray IMPRESSION: Mild lower lumbar spondylosis. No acute findings.  PATIENT SURVEYS:  FOTO 31.66 on 02/22/22  LUMBAR ROM:   AROM eval 02/22/22 Progress report  Flexion 48; limited by pain 48; very painful   Extension 14; limited by pain 20; slight pain  Right lateral flexion 25% limited; slight pain  50% limited; slight pain  Left lateral flexion 25% limited; slight pain 50% limited; painful  Right rotation 50% limited; limited by pain 25% limited; limited by pain  Left rotation 50% limited; limited by pain 25% limited; limited by pain   (Blank rows = not tested)  LOWER EXTREMITY ROM: WFL for activities assessed  LOWER EXTREMITY MMT:    MMT Right eval Left eval  Hip flexion 3/5; groin pain 3+/5; groin pain  Hip extension    Hip abduction    Hip adduction    Hip internal rotation    Hip external rotation    Knee flexion 3+/5 4-/5  Knee extension 3/5; shooting pain into LB (R>L) 3/5; shooting pain into LB (R>L)  Ankle dorsiflexion 3/5 3/5  Ankle plantarflexion    Ankle inversion    Ankle eversion     (Blank rows = not tested)  TODAY'S TREATMENT:                                                                                                                               DATE:                                     1/31 EXERCISE LOG  Exercise Repetitions and Resistance Comments  Nustep  L4 x 17 minutes   Isometric ball press 15 reps w/ 5 second hold each  Neutral and with slight lumbar rotation  LTR 2 minutes   DKC  2 minutes  LE supported on red ball  Glute set  15 reps w/ 5 second hold    Blank cell = exercise not performed today                                     02/10/22 EXERCISE LOG  Exercise Repetitions and Resistance Comments  Nustep L4 x18 min   Cabinet press for core X10 reps 5 sec   SKTC BLE 3x20 sec   LTR 5x15 sec   Lat pulldown Blue XTS x15 reps   Shoulder row Blue XTS x15 reps    Blank cell = exercise not performed today   Manual Therapy in prone for comfort and accessibility Soft Tissue Mobilization: B lumbar paraspinals, to reduce tightness reported during therex    PATIENT EDUCATION:  Education details: POC, progress with  therapy, and MD follow up  Person educated: Patient Education method: Explanation, handout Education comprehension: verbalized understanding  HOME EXERCISE PROGRAM:  ASSESSMENT:  CLINICAL IMPRESSION: Patient has minimal progress with skilled physical therapy as evidenced by her subjective reports, objective measures, functional mobility, and goals for physical therapy. She was able to meet her goals for improved standing and walking tolerance. However, she continues to be limited by pain with functional activities such as working or cleaning. Treatment focused on familiar interventions for reduced pain and improved lumbar mobility. She required minimal cueing with isometric interventions for light muscular engagement. She reported that her back felt a little better upon the conclusion of treatment. She may benefit from additional medication intervention due to her continued high pain severity and irritability.   PHYSICAL THERAPY DISCHARGE SUMMARY  Visits from Start of Care: 5  Current  functional level related to goals / functional outcomes: See clinical impression statement   Remaining deficits: Pain    Education / Equipment: HEP   Patient agrees to discharge. Patient goals were partially met. Patient is being discharged due to not returning since the last visit.  Candi Leash, PT, DPT    OBJECTIVE IMPAIRMENTS: Abnormal gait, decreased activity tolerance, decreased mobility, difficulty walking, decreased ROM, decreased strength, hypomobility, impaired flexibility, impaired tone, postural dysfunction, and pain.   ACTIVITY LIMITATIONS: carrying, lifting, bending, sitting, standing, squatting, stairs, transfers, bed mobility, dressing, and locomotion level  PARTICIPATION LIMITATIONS: meal prep, cleaning, laundry, driving, shopping, community activity, occupation, and yard work  PERSONAL FACTORS: Time since onset of injury/illness/exacerbation and 1 comorbidity: Diabetes  are also affecting patient's functional outcome.   REHAB POTENTIAL: Fair    CLINICAL DECISION MAKING: Evolving/moderate complexity  EVALUATION COMPLEXITY: Moderate  GOALS: Goals reviewed with patient? Yes  SHORT TERM GOALS: Target date: 02/01/22  Patient will be independent with her initial HEP. Baseline: Goal status: MET  2.  Patient will be able to complete her daily activities without her familiar low back and referred pain exceeding 5/10. Baseline:  Goal status: IN PROGRESS  3.  Patient will be able to stand for at least 20 minutes prior to an increase in her familiar low back pain. Baseline: about 45 minutes Goal status: MET  LONG TERM GOALS: Target date: 02/22/22  Patient will be independent with her advanced HEP.  Baseline:  Goal status: IN PROGRESS  2.  Patient will be able to navigate at least 4 steps with a reciprocal gait pattern for improved household mobility. Baseline:  Goal status: IN PROGRESS  3.  Patient will be able to stand and walk for at least 30 minutes prior  to her increase in her familiar low back pain for improved function with her daily activities. Baseline: about 45 minutes Goal status: MET  4.  Patient be able to complete her daily activities without her familiar low back pain exceeding 3/10. Baseline:  Goal status: IN PROGRESS  PLAN:  PT FREQUENCY: 1-2x/week  PT DURATION: 6 weeks  PLANNED INTERVENTIONS: Therapeutic exercises, Therapeutic activity, Neuromuscular re-education, Balance training, Gait training, Patient/Family education, Self Care, Joint mobilization, Stair training, Dry Needling, Electrical stimulation, Spinal mobilization, Cryotherapy, Moist heat, Traction, Ultrasound, Manual therapy, and Re-evaluation.  PLAN FOR NEXT SESSION: NuStep, lumbar stability, lower extremity strengthening, manual therapy as tolerated, and modalities as needed  Granville Lewis, PT 02/22/2022, 2:56 PM

## 2022-02-23 ENCOUNTER — Encounter: Payer: Self-pay | Admitting: Orthopaedic Surgery

## 2022-02-23 ENCOUNTER — Ambulatory Visit (INDEPENDENT_AMBULATORY_CARE_PROVIDER_SITE_OTHER): Payer: BC Managed Care – PPO | Admitting: Orthopaedic Surgery

## 2022-02-23 VITALS — BP 166/85 | HR 86

## 2022-02-23 DIAGNOSIS — M545 Low back pain, unspecified: Secondary | ICD-10-CM | POA: Diagnosis not present

## 2022-02-23 DIAGNOSIS — M79605 Pain in left leg: Secondary | ICD-10-CM | POA: Diagnosis not present

## 2022-02-23 MED ORDER — HYDROCODONE-ACETAMINOPHEN 5-325 MG PO TABS: ORAL_TABLET | ORAL | 0 refills | Status: DC

## 2022-02-23 NOTE — Progress Notes (Signed)
My back is still hurting.  She has pain in the back still.  She has been to PT four times with no help.  She is doing exercises at home.  I have reviewed the PT notes.  I will refill pain medicine.  Spine/Pelvis examination:  Inspection:  Overall, sacoiliac joint benign and hips nontender; without crepitus or defects.   Thoracic spine inspection: Alignment normal without kyphosis present   Lumbar spine inspection:  Alignment  with normal lumbar lordosis, without scoliosis apparent.   Thoracic spine palpation:  without tenderness of spinal processes   Lumbar spine palpation: without tenderness of lumbar area; without tightness of lumbar muscles    Range of Motion:   Lumbar flexion, forward flexion is normal without pain or tenderness    Lumbar extension is full without pain or tenderness   Left lateral bend is normal without pain or tenderness   Right lateral bend is normal without pain or tenderness   Straight leg raising is normal  Strength & tone: normal   Stability overall normal stability  Encounter Diagnosis  Name Primary?   Lumbar pain with radiation down left leg Yes   Return in two weeks.  Get MRI of the lumbar spine.  Call if any problem.  Precautions discussed.  Electronically Signed Sanjuana Kava, MD 2/1/20248:20 AM

## 2022-02-23 NOTE — Addendum Note (Signed)
Addended by: Obie Dredge A on: 02/23/2022 09:08 AM   Modules accepted: Orders

## 2022-02-23 NOTE — Patient Instructions (Signed)
Central Scheduling [phone]  While we are working on your approval please go ahead and call to schedule your appointment to be done within one week. If you can not get an appointment at [place] within the next week, ask if they have something sooner at [facility] or [place] if you are able to go to [place] to have the imaging done.  AFTER you have made your imaging appointment, please call our office back at [phone] to schedule an appointment to review your results.   

## 2022-03-07 ENCOUNTER — Ambulatory Visit: Payer: BC Managed Care – PPO | Admitting: Family Medicine

## 2022-03-09 ENCOUNTER — Ambulatory Visit: Payer: BC Managed Care – PPO | Admitting: Orthopaedic Surgery

## 2022-03-15 ENCOUNTER — Ambulatory Visit (HOSPITAL_COMMUNITY)
Admission: RE | Admit: 2022-03-15 | Discharge: 2022-03-15 | Disposition: A | Discharge status: Home / Self Care | Payer: BC Managed Care – PPO | Source: Ambulatory Visit | Attending: Orthopaedic Surgery | Admitting: Orthopaedic Surgery

## 2022-03-15 DIAGNOSIS — M79605 Pain in left leg: Secondary | ICD-10-CM | POA: Insufficient documentation

## 2022-03-15 DIAGNOSIS — M545 Low back pain, unspecified: Secondary | ICD-10-CM | POA: Insufficient documentation

## 2022-03-21 ENCOUNTER — Encounter: Payer: Self-pay | Admitting: Orthopaedic Surgery

## 2022-03-21 ENCOUNTER — Ambulatory Visit (INDEPENDENT_AMBULATORY_CARE_PROVIDER_SITE_OTHER): Payer: BC Managed Care – PPO | Admitting: Orthopaedic Surgery

## 2022-03-21 DIAGNOSIS — M79605 Pain in left leg: Secondary | ICD-10-CM | POA: Diagnosis not present

## 2022-03-21 DIAGNOSIS — M545 Low back pain, unspecified: Secondary | ICD-10-CM

## 2022-03-21 MED ORDER — HYDROCODONE-ACETAMINOPHEN 5-325 MG PO TABS: ORAL_TABLET | ORAL | 0 refills | Status: DC

## 2022-03-21 NOTE — Patient Instructions (Signed)
You have been referred to [facility] on [address] in [place]. They will call you to schedule the appointment. If you have not heard anything in one week call them to schedule [phone]. This referral has to be uploaded to another system because they are not on the same medical records system as we are. Please allow [place] two business days to get this uploaded for you.   DR.KEELING'S SCHEDULE IS AS FOLLOWS: TUESDAY: ALL DAY WEDNESDAY: MORNING ONLY THURSDAY: MORNING ONLY PLEASE CALL OR SEND A MESSAGE VIA MYCHART BY WEDNESDAY MORNING SO THAT I CAN SEND A REQUEST TO HIM. HE LEAVES THURSDAY BY 11:30AM. HE DOES NOT RETURN TO THE OFFICE UNTIL TUESDAY MORNINGS AND HE DOES NOT CHECK HIS WORK MESSAGES DURING HIS TIME AWAY FROM THE OFFICE. I'M ABBY AND IF YOU NEED SOMETHING, SEND ME A MYCHART MESSAGE OR CALL. MYCHART IS THE FASTEST WAY TO GET IN CONTACT WITH ME.  My name is Abby and I work with Dr.Keeling. If you need anything before your next appointment, please do not hesitate to call the office at [phone] and ask to leave a message for me. I will respond within 24-48 business hours.   FOLLOW UP AFTER SEEN BY NEUROSURGERY

## 2022-03-21 NOTE — Progress Notes (Signed)
My back hurts.  She has more pain in the lower back.  She has no new trauma or weakness.  She had MRI of the lower back which showed: IMPRESSION: 1. Mild bilateral lateral recess encroachment at L2-3. 2. Shallow broad-based left paracentral and left foraminal disc protrusion at L3-4 causing moderate left lateral recess stenosis and mild left foraminal stenosis. Potential irritation of both the L3 and L4 nerve roots. 3. Mild to moderate bilateral lateral recess stenosis at L4-5. 4. Stable shallow bilobed disc protrusion at L5-S1 with mild mass effect on both sides of the thecal sac. Potential irritation of the S1 nerve roots in the lateral recess but no significant spinal or foraminal stenosis.  I have informed her of the findings.  I will have her see neurosurgery.  She is agreeable to this.  I have independently reviewed the MRI.    She has pain in the lower back with decreased motion, NV Intact, SLR negative, she is very tender, slow walking.  Toe/heel intact.  No spasm.  Muscle tone and strength normal.  Encounter Diagnosis  Name Primary?   Lumbar pain with radiation down left leg Yes   To see neurosurgeon.  I will refill pain medicine.  I have reviewed the [place] web site prior to prescribing narcotic medicine for this patient.  Call if any problem.  Precautions discussed.  To stay out of work until sees Neurosurgeon  Electronically [place], MD 2/27/20248:39 AM

## 2022-03-23 ENCOUNTER — Encounter: Payer: Self-pay | Admitting: Radiology

## 2022-03-24 ENCOUNTER — Telehealth: Payer: Self-pay | Admitting: Orthopaedic Surgery

## 2022-03-24 NOTE — Telephone Encounter (Signed)
Patient called, lvm stating she hasn't heard anything from the neurosurgeon.  She would like a call back.  Pt's # [phone]

## 2022-03-24 NOTE — Telephone Encounter (Signed)
Spoke with patient. Advised her that the referral has been sent and that they have to review it. Provided patient with the number so that she can call and schedule her appointment.

## 2022-03-28 ENCOUNTER — Ambulatory Visit: Payer: BC Managed Care – PPO | Admitting: Family Medicine

## 2022-04-11 ENCOUNTER — Ambulatory Visit: Payer: BC Managed Care – PPO | Admitting: Orthopaedic Surgery

## 2022-04-12 ENCOUNTER — Ambulatory Visit: Payer: BC Managed Care – PPO | Admitting: Family Medicine

## 2022-04-12 ENCOUNTER — Encounter: Payer: Self-pay | Admitting: Family Medicine

## 2022-04-12 ENCOUNTER — Telehealth: Payer: Self-pay | Admitting: *Deleted

## 2022-04-12 VITALS — BP 137/82 | HR 92 | Temp 96.0°F | Ht 68.0 in | Wt 213.2 lb

## 2022-04-12 DIAGNOSIS — I700 Atherosclerosis of aorta: Secondary | ICD-10-CM

## 2022-04-12 DIAGNOSIS — M545 Low back pain, unspecified: Secondary | ICD-10-CM | POA: Insufficient documentation

## 2022-04-12 DIAGNOSIS — E1169 Type 2 diabetes mellitus with other specified complication: Secondary | ICD-10-CM

## 2022-04-12 DIAGNOSIS — E785 Hyperlipidemia, unspecified: Secondary | ICD-10-CM

## 2022-04-12 DIAGNOSIS — E1165 Type 2 diabetes mellitus with hyperglycemia: Secondary | ICD-10-CM

## 2022-04-12 LAB — BAYER DCA HB A1C WAIVED: HB A1C (BAYER DCA - WAIVED): 12.6 % — ABNORMAL HIGH (ref 4.8–5.6)

## 2022-04-12 MED ORDER — EMPAGLIFLOZIN 10 MG PO TABS: 10.0000 mg | ORAL_TABLET | Freq: Every day | ORAL | 3 refills | Status: DC

## 2022-04-12 MED ORDER — TRULICITY 3 MG/0.5ML ~~LOC~~ SOAJ: 3.0000 mg | SUBCUTANEOUS | 1 refills | Status: DC

## 2022-04-12 MED ORDER — ATORVASTATIN CALCIUM 10 MG PO TABS: 10.0000 mg | ORAL_TABLET | Freq: Every day | ORAL | 1 refills | Status: DC

## 2022-04-12 MED ORDER — SEMAGLUTIDE (1 MG/DOSE) 4 MG/3ML ~~LOC~~ SOPN: 1.0000 mg | PEN_INJECTOR | SUBCUTANEOUS | 3 refills | Status: DC

## 2022-04-12 MED ORDER — ENALAPRIL MALEATE 2.5 MG PO TABS: 2.5000 mg | ORAL_TABLET | Freq: Every day | ORAL | 1 refills | Status: DC

## 2022-04-12 NOTE — Progress Notes (Signed)
Subjective:  Patient ID: Catherine Chapman, female    DOB: 1960/11/29, 62 y.o.   MRN: [id number]  Patient Care Team: Baruch Gouty, FNP as PCP - General (Family Medicine) Harlen Labs, MD as Referring Physician (Optometry)   Chief Complaint:  Diabetes (3 month follow up ) and Joint Swelling (Left ankle x 2 weeks)   HPI: Catherine Chapman is a 62 y.o. female presenting on 04/12/2022 for Diabetes (3 month follow up ) and Joint Swelling (Left ankle x 2 weeks)  States her left ankle has been slightly swollen for the last 2 weeks. No injury or associated pain.   1. Type 2 diabetes mellitus with hyperglycemia, without long-term current use of insulin (HCC) Has only been taking Trulicity [id number] mg once weekly for diabetes management. Did not follow up for 3 month recheck. States she has not been exercising but does try to watch her diet. No polyuria, polyphagia, or polydipsia.   2. Hyperlipidemia associated with type 2 diabetes mellitus ([place]) Has been taking statin medications. Denies myalgias. Does not exercise on a regular basis. Does try to watch diet.   3. Aortic atherosclerosis (HCC) On ASA and statin therapy. No anginal symptoms.      Relevant past medical, surgical, family, and social history reviewed and updated as indicated.  Allergies and medications reviewed and updated. Data reviewed: Chart in Epic.   Past Medical History:  Diagnosis Date   Aortic atherosclerosis ([place])    Back pain    Diabetes ([place])    Mediastinal lymphadenopathy 03/06/2021   Chest CT at [facility]   Pulmonary nodule 03/06/2021   Chest CT at [facility]    Past Surgical History:  Procedure Laterality Date   I & D EXTREMITY Left 01/17/2017   Procedure: IRRIGATION AND DEBRIBEMENT WITH OPEN REDUCTION INTERNAL FIXATION LEFT INDEX FINGER WITH EXTENSOR TENDON REPAIR;  Surgeon: Charlotte Crumb, MD;  Location: [facility];  Service: Orthopedics;  Laterality: Left;   KNEE SURGERY     KNEE SURGERY      ROTATOR CUFF REPAIR      Social History   Socioeconomic History   Marital status: Single    Spouse name: Not on file   Number of children: Not on file   Years of education: Not on file   Highest education level: Not on file  Occupational History   Not on file  Tobacco Use   Smoking status: Former    Types: Cigarettes    Quit date: 89    Years since quitting: 34.2   Smokeless tobacco: Never  Vaping Use   Vaping Use: Never used  Substance and Sexual Activity   Alcohol use: Yes    Comment: occ   Drug use: No   Sexual activity: Yes    Birth control/protection: None  Other Topics Concern   Not on file  Social History Narrative   Not on file   Social Determinants of Health   Financial Resource Strain: Not on file  Food Insecurity: Not on file  Transportation Needs: Not on file  Physical Activity: Not on file  Stress: Not on file  Social Connections: Not on file  Intimate Partner Violence: Not on file    Outpatient Encounter Medications as of 04/12/2022  Medication Sig   aspirin 81 MG EC tablet Take 1 tablet (81 mg total) by mouth daily. Swallow whole.   Continuous Blood Gluc Sensor (FREESTYLE LIBRE 3 SENSOR) MISC 1 Device by Does not apply route  every 14 (fourteen) days. Place 1 sensor on the skin every 14 days. Use to check glucose continuously   empagliflozin (JARDIANCE) 10 MG TABS tablet Take 1 tablet (10 mg total) by mouth daily before breakfast.   HYDROcodone-acetaminophen (NORCO/VICODIN) 5-325 MG tablet One tablet every six hours for pain.  Limit 7 days.   Insulin Pen Needle 32G X 4 MM MISC Use with insulin daily Dx E11.69   polyethylene glycol powder (GLYCOLAX/MIRALAX) 17 GM/SCOOP powder Take 17 g by mouth daily.   Semaglutide, 1 MG/DOSE, 4 MG/3ML SOPN Inject 1 mg as directed once a week.   [DISCONTINUED] atorvastatin (LIPITOR) 10 MG tablet Take 1 tablet (10 mg total) by mouth daily.   [DISCONTINUED] Dulaglutide (TRULICITY) 4.09 WJ/1.9JY SOPN Inject 0.75 mg into  the skin once a week. DX: E11.65   [DISCONTINUED] enalapril (VASOTEC) 2.5 MG tablet Take 1 tablet (2.5 mg total) by mouth daily.   atorvastatin (LIPITOR) 10 MG tablet Take 1 tablet (10 mg total) by mouth daily.   enalapril (VASOTEC) 2.5 MG tablet Take 1 tablet (2.5 mg total) by mouth daily.   [DISCONTINUED] insulin glargine (LANTUS) 100 UNIT/ML injection Inject 15 Units into the skin daily. (Patient not taking: Reported on 04/12/2022)   [DISCONTINUED] naproxen (NAPROSYN) 500 MG tablet Take 1 tablet (500 mg total) by mouth 2 (two) times daily with a meal. (Patient not taking: Reported on 04/12/2022)   No facility-administered encounter medications on file as of 04/12/2022.    Allergies  Allergen Reactions   Tramadol Nausea And Vomiting   Metformin Other (See Comments)    Headaches   Metformin And Related Other (See Comments)    Headaches    Review of Systems  Constitutional:  Negative for activity change, appetite change, chills, diaphoresis, fatigue, fever and unexpected weight change.  HENT: Negative.    Eyes: Negative.  Negative for photophobia and visual disturbance.  Respiratory:  Negative for cough, chest tightness and shortness of breath.   Cardiovascular:  Negative for chest pain, palpitations and leg swelling.  Gastrointestinal:  Negative for abdominal pain, blood in stool, constipation, diarrhea, nausea and vomiting.  Endocrine: Negative.  Negative for polydipsia, polyphagia and polyuria.  Genitourinary:  Negative for decreased urine volume, difficulty urinating, dysuria, frequency and urgency.  Musculoskeletal:  Positive for arthralgias, back pain and joint swelling (left ankle). Negative for gait problem, myalgias and neck pain.  Skin: Negative.   Allergic/Immunologic: Negative.   Neurological:  Negative for dizziness, tremors, seizures, syncope, facial asymmetry, speech difficulty, weakness, light-headedness, numbness and headaches.  Hematological: Negative.    Psychiatric/Behavioral:  Negative for confusion, hallucinations, sleep disturbance and suicidal ideas.   All other systems reviewed and are negative.       Objective:  BP 137/82   Pulse 92   Temp (!) 96 F (35.6 C) (Temporal)   Ht 5\' 8"  (1.727 m)   Wt 213 lb 3.2 oz (96.7 kg)   SpO2 96%   BMI 32.42 kg/m    Wt Readings from Last 3 Encounters:  04/12/22 213 lb 3.2 oz (96.7 kg)  12/22/21 195 lb (88.5 kg)  12/08/21 193 lb (87.5 kg)    Physical Exam Vitals and nursing note reviewed.  Constitutional:      General: She is not in acute distress.    Appearance: Normal appearance. She is well-developed and well-groomed. She is obese. She is not ill-appearing, toxic-appearing or diaphoretic.  HENT:     Head: Normocephalic and atraumatic.     Jaw: There is normal jaw  occlusion.     Right Ear: Hearing normal.     Left Ear: Hearing normal.     Nose: Nose normal.     Mouth/Throat:     Lips: Pink.     Mouth: Mucous membranes are moist.     Pharynx: Oropharynx is clear. Uvula midline.  Eyes:     General: Lids are normal.     Extraocular Movements: Extraocular movements intact.     Conjunctiva/sclera: Conjunctivae normal.     Pupils: Pupils are equal, round, and reactive to light.  Neck:     Thyroid: No thyroid mass, thyromegaly or thyroid tenderness.     Vascular: No carotid bruit or JVD.     Trachea: Trachea and phonation normal.  Cardiovascular:     Rate and Rhythm: Normal rate and regular rhythm.     Chest Wall: PMI is not displaced.     Pulses: Normal pulses.          Dorsalis pedis pulses are 2+ on the right side and 2+ on the left side.       Posterior tibial pulses are 2+ on the right side and 2+ on the left side.     Heart sounds: Normal heart sounds. No murmur heard.    No friction rub. No gallop.  Pulmonary:     Effort: Pulmonary effort is normal. No respiratory distress.     Breath sounds: Normal breath sounds. No wheezing.  Abdominal:     General: Bowel sounds  are normal. There is no distension or abdominal bruit.     Palpations: Abdomen is soft. There is no hepatomegaly or splenomegaly.     Tenderness: There is no abdominal tenderness. There is no right CVA tenderness or left CVA tenderness.     Hernia: No hernia is present.  Musculoskeletal:        General: Normal range of motion.     Cervical back: Normal range of motion and neck supple.     Right lower leg: Normal. No edema.     Left lower leg: Normal. No edema.     Left ankle: Swelling present. No deformity, ecchymosis or lacerations. No tenderness. Normal range of motion. Anterior drawer test negative.  Feet:     Right foot:     Protective Sensation: 10 sites tested.  10 sites sensed.     Skin integrity: Skin integrity normal.     Toenail Condition: Right toenails are abnormally thick.     Left foot:     Protective Sensation: 10 sites tested.  10 sites sensed.     Skin integrity: Skin integrity normal.     Toenail Condition: Left toenails are abnormally thick.  Lymphadenopathy:     Cervical: No cervical adenopathy.  Skin:    General: Skin is warm and dry.     Capillary Refill: Capillary refill takes less than 2 seconds.     Coloration: Skin is not cyanotic, jaundiced or pale.     Findings: No rash.  Neurological:     General: No focal deficit present.     Mental Status: She is alert and oriented to person, place, and time.     Sensory: Sensation is intact.     Motor: Motor function is intact.     Coordination: Coordination is intact.     Gait: Gait is intact.     Deep Tendon Reflexes: Reflexes are normal and symmetric.  Psychiatric:        Attention and Perception: Attention and perception normal.  Mood and Affect: Mood and affect normal.        Speech: Speech normal.        Behavior: Behavior normal. Behavior is cooperative.        Thought Content: Thought content normal.        Cognition and Memory: Cognition and memory normal.        Judgment: Judgment normal.      Results for orders placed or performed in visit on 11/11/21  Lipid panel  Result Value Ref Range   Cholesterol, Total 191 100 - 199 mg/dL   Triglycerides 81 0 - 149 mg/dL   HDL 59 >39 mg/dL   VLDL Cholesterol Cal 15 5 - 40 mg/dL   LDL Chol Calc (NIH) 117 (H) 0 - 99 mg/dL   Chol/HDL Ratio 3.2 0.0 - 4.4 ratio  Bayer DCA Hb A1c Waived  Result Value Ref Range   HB A1C (BAYER DCA - WAIVED) 8.9 (H) 4.8 - 5.6 %  CMP14+EGFR  Result Value Ref Range   Glucose 184 (H) 70 - 99 mg/dL   BUN 20 8 - 27 mg/dL   Creatinine, Ser 0.75 0.57 - 1.00 mg/dL   eGFR 91 >59 mL/min/1.73   BUN/Creatinine Ratio 27 12 - 28   Sodium 135 134 - 144 mmol/L   Potassium 4.3 3.5 - 5.2 mmol/L   Chloride 96 96 - 106 mmol/L   CO2 23 20 - 29 mmol/L   Calcium 9.7 8.7 - 10.3 mg/dL   Total Protein 7.6 6.0 - 8.5 g/dL   Albumin 4.0 3.9 - 4.9 g/dL   Globulin, Total 3.6 1.5 - 4.5 g/dL   Albumin/Globulin Ratio 1.1 (L) 1.2 - 2.2   Bilirubin Total 0.3 0.0 - 1.2 mg/dL   Alkaline Phosphatase 128 (H) 44 - 121 IU/L   AST 31 0 - 40 IU/L   ALT 35 (H) 0 - 32 IU/L       Pertinent labs & imaging results that were available during my care of the patient were reviewed by me and considered in my medical decision making.  Assessment & Plan:  Catherine Chapman was seen today for diabetes and joint swelling.  Diagnoses and all orders for this visit:  Type 2 diabetes mellitus with hyperglycemia, without long-term current use of insulin (HCC) A1C 12.6. Will switch Trulicity to Ozempic for better A1C control. Restart Jardiance. Diet and exercise discussed in detail. Follow up in 3 months.  -     CMP14+EGFR -     Bayer DCA Hb A1c Waived -     Microalbumin / creatinine urine ratio -     Semaglutide, 1 MG/DOSE, 4 MG/3ML SOPN; Inject 1 mg as directed once a week. -     atorvastatin (LIPITOR) 10 MG tablet; Take 1 tablet (10 mg total) by mouth daily. -     enalapril (VASOTEC) 2.5 MG tablet; Take 1 tablet (2.5 mg total) by mouth daily. -      empagliflozin (JARDIANCE) 10 MG TABS tablet; Take 1 tablet (10 mg total) by mouth daily before breakfast.  Hyperlipidemia associated with type 2 diabetes mellitus ([place]) Diet encouraged - increase intake of fresh fruits and vegetables, increase intake of lean proteins. Bake, broil, or grill foods. Avoid fried, greasy, and fatty foods. Avoid fast foods. Increase intake of fiber-rich whole grains. Exercise encouraged - at least 150 minutes per week and advance as tolerated.  Goal BMI < 25. Continue medications as prescribed. Follow up in 3-6 months as discussed.  -  Lipid panel -     atorvastatin (LIPITOR) 10 MG tablet; Take 1 tablet (10 mg total) by mouth daily.  Aortic atherosclerosis (HCC) Continue ASA and stain therapy.  -     atorvastatin (LIPITOR) 10 MG tablet; Take 1 tablet (10 mg total) by mouth daily.     Continue all other maintenance medications.  Follow up plan: Return in about 3 months (around 07/13/2022) for DM - 3 months.   Continue healthy lifestyle choices, including diet (rich in fruits, vegetables, and lean proteins, and low in salt and simple carbohydrates) and exercise (at least 30 minutes of moderate physical activity daily).  Educational handout given for DM  The above assessment and management plan was discussed with the patient. The patient verbalized understanding of and has agreed to the management plan. Patient is aware to call the clinic if they develop any new symptoms or if symptoms persist or worsen. Patient is aware when to return to the clinic for a follow-up visit. Patient educated on when it is appropriate to go to the emergency department.   Monia Pouch, FNP-C [facility] [phone]

## 2022-04-12 NOTE — Telephone Encounter (Signed)
Pt called said she went to the pharmacy to pick up her new Rx for the Ozempic, pharmacy said it was going to be $1400 Any receommendations

## 2022-04-12 NOTE — Patient Instructions (Addendum)

## 2022-04-12 NOTE — Addendum Note (Signed)
Addended by: Baruch Gouty on: 04/12/2022 06:53 PM   Modules accepted: Orders

## 2022-04-13 LAB — CMP14+EGFR
ALT: 37 IU/L — ABNORMAL HIGH (ref 0–32)
AST: 29 IU/L (ref 0–40)
Albumin/Globulin Ratio: 1.3 (ref 1.2–2.2)
Albumin: 4.1 g/dL (ref 3.9–4.9)
Alkaline Phosphatase: 151 IU/L — ABNORMAL HIGH (ref 44–121)
BUN/Creatinine Ratio: 16 (ref 12–28)
BUN: 15 mg/dL (ref 8–27)
Bilirubin Total: 0.4 mg/dL (ref 0.0–1.2)
CO2: 21 mmol/L (ref 20–29)
Calcium: 9.1 mg/dL (ref 8.7–10.3)
Chloride: 97 mmol/L (ref 96–106)
Creatinine, Ser: 0.93 mg/dL (ref 0.57–1.00)
Globulin, Total: 3.2 g/dL (ref 1.5–4.5)
Glucose: 339 mg/dL — ABNORMAL HIGH (ref 70–99)
Potassium: 3.9 mmol/L (ref 3.5–5.2)
Sodium: 135 mmol/L (ref 134–144)
Total Protein: 7.3 g/dL (ref 6.0–8.5)
eGFR: 70 mL/min/{1.73_m2} (ref >59)

## 2022-04-13 LAB — LIPID PANEL
Chol/HDL Ratio: 3.2 ratio (ref 0.0–4.4)
Cholesterol, Total: 162 mg/dL (ref 100–199)
HDL: 50 mg/dL (ref >39)
LDL Chol Calc (NIH): 78 mg/dL (ref 0–99)
Triglycerides: 207 mg/dL — ABNORMAL HIGH (ref 0–149)
VLDL Cholesterol Cal: 34 mg/dL (ref 5–40)

## 2022-04-13 NOTE — Telephone Encounter (Signed)
Patient aware and verbalizes understanding. 

## 2022-04-18 ENCOUNTER — Ambulatory Visit (INDEPENDENT_AMBULATORY_CARE_PROVIDER_SITE_OTHER): Payer: BC Managed Care – PPO | Admitting: Orthopaedic Surgery

## 2022-04-18 ENCOUNTER — Encounter: Payer: Self-pay | Admitting: Orthopaedic Surgery

## 2022-04-18 ENCOUNTER — Telehealth: Payer: Self-pay | Admitting: Orthopaedic Surgery

## 2022-04-18 VITALS — BP 121/77 | HR 89 | Ht 68.0 in | Wt 213.0 lb

## 2022-04-18 DIAGNOSIS — M79605 Pain in left leg: Secondary | ICD-10-CM | POA: Diagnosis not present

## 2022-04-18 DIAGNOSIS — M25472 Effusion, left ankle: Secondary | ICD-10-CM | POA: Diagnosis not present

## 2022-04-18 DIAGNOSIS — M545 Low back pain, unspecified: Secondary | ICD-10-CM | POA: Diagnosis not present

## 2022-04-18 MED ORDER — HYDROCODONE-ACETAMINOPHEN 5-325 MG PO TABS: ORAL_TABLET | ORAL | 0 refills | Status: DC

## 2022-04-18 NOTE — Telephone Encounter (Signed)
Dr. Brooke Bonito patient - at checkout, pt stated she forgot to ask for a refill.  Would like a refill on Hydrocodone 5-325 to be sent to [facility].

## 2022-04-18 NOTE — Patient Instructions (Addendum)
Call [facility] and schedule the injections for your back.   [facility] [phone]  Give patient a note to be out until she has ESI with [facility].   Follow up: 1 month  Get compression socks for your foot. You can find them in the undergarment section at [facility] or buy them on [employer]. Put them on first thing in the morning before you start your day.    Don't wear them at night while you're sleeping. If they become uncomfortable, take them off.   If you need anything, please reach out.

## 2022-04-18 NOTE — Progress Notes (Signed)
I saw that doctor  She saw Dr. Arnoldo Morale, the neurosurgeon.  I have reviewed the notes.  He has suggested pain management and epidural injection.  She is to schedule the injection this week or next week.  Her pain is the same.  Spine/Pelvis examination:  Inspection:  Overall, sacoiliac joint benign and hips nontender; without crepitus or defects.   Thoracic spine inspection: Alignment normal without kyphosis present   Lumbar spine inspection:  Alignment  with normal lumbar lordosis, without scoliosis apparent.   Thoracic spine palpation:  without tenderness of spinal processes   Lumbar spine palpation: without tenderness of lumbar area; without tightness of lumbar muscles    Range of Motion:   Lumbar flexion, forward flexion is normal without pain or tenderness    Lumbar extension is full without pain or tenderness   Left lateral bend is normal without pain or tenderness   Right lateral bend is normal without pain or tenderness   Straight leg raising is normal  Strength & tone: normal   Stability overall normal stability  She also has some lateral ankle swelling at times.  I have recommended compression hose for the left.  Encounter Diagnoses  Name Primary?   Lumbar pain with radiation down left leg Yes   Swelling of left ankle joint    Return in one month.  Keep schedule for injection of lower back.  Call if any problem.  Precautions discussed.  Electronically Signed Sanjuana Kava, MD 3/26/20248:43 AM

## 2022-04-18 NOTE — Telephone Encounter (Signed)
Dr. Luna Glasgow pt - pt lvm requesting a call back, she stated that she just received a call stating that our office never submitted paperwork for her to have her MRI.

## 2022-04-28 NOTE — Telephone Encounter (Signed)
Patient seen by [facility] 04/07/22.

## 2022-05-30 ENCOUNTER — Encounter: Payer: Self-pay | Admitting: Orthopaedic Surgery

## 2022-05-30 ENCOUNTER — Ambulatory Visit (INDEPENDENT_AMBULATORY_CARE_PROVIDER_SITE_OTHER): Payer: BC Managed Care – PPO | Admitting: Orthopaedic Surgery

## 2022-05-30 VITALS — BP 131/81 | HR 81 | Ht 68.0 in | Wt 210.0 lb

## 2022-05-30 DIAGNOSIS — M545 Low back pain, unspecified: Secondary | ICD-10-CM

## 2022-05-30 DIAGNOSIS — M79605 Pain in left leg: Secondary | ICD-10-CM

## 2022-05-30 MED ORDER — HYDROCODONE-ACETAMINOPHEN 5-325 MG PO TABS: ORAL_TABLET | ORAL | 0 refills | Status: DC

## 2022-05-30 NOTE — Progress Notes (Signed)
I am a little better but I still have leg pain.  She has had the epidural and is better with her lower back.  She has pain to the thighs but no weakness. She is doing her exercises and taking her medicine.  I will keep her out of work this month.  Spine/Pelvis examination:  Inspection:  Overall, sacoiliac joint benign and hips nontender; without crepitus or defects.   Thoracic spine inspection: Alignment normal without kyphosis present   Lumbar spine inspection:  Alignment  with normal lumbar lordosis, without scoliosis apparent.   Thoracic spine palpation:  without tenderness of spinal processes   Lumbar spine palpation: without tenderness of lumbar area; without tightness of lumbar muscles    Range of Motion:   Lumbar flexion, forward flexion is normal without pain or tenderness    Lumbar extension is full without pain or tenderness   Left lateral bend is normal without pain or tenderness   Right lateral bend is normal without pain or tenderness   Straight leg raising is normal  Strength & tone: normal   Stability overall normal stability  He left ankle pain has resolved.  Encounter Diagnosis  Name Primary?   Lumbar pain with radiation down left leg Yes   Return in one month.  I have reviewed the [place] Controlled Substance Reporting System web site prior to prescribing narcotic medicine for this patient.  Call if any problem.  Precautions discussed.  Electronically Signed Darreld Mclean, MD 5/7/20248:24 AM;

## 2022-05-30 NOTE — Patient Instructions (Signed)
OUT of WORK for the month.

## 2022-06-27 ENCOUNTER — Telehealth: Payer: Self-pay | Admitting: Orthopaedic Surgery

## 2022-06-27 ENCOUNTER — Encounter: Payer: Self-pay | Admitting: Orthopaedic Surgery

## 2022-06-27 ENCOUNTER — Ambulatory Visit (INDEPENDENT_AMBULATORY_CARE_PROVIDER_SITE_OTHER): Payer: BC Managed Care – PPO | Admitting: Orthopaedic Surgery

## 2022-06-27 VITALS — BP 116/71 | HR 93 | Ht 68.0 in | Wt 210.0 lb

## 2022-06-27 DIAGNOSIS — M79605 Pain in left leg: Secondary | ICD-10-CM

## 2022-06-27 DIAGNOSIS — M545 Low back pain, unspecified: Secondary | ICD-10-CM | POA: Diagnosis not present

## 2022-06-27 MED ORDER — HYDROCODONE-ACETAMINOPHEN 5-325 MG PO TABS: ORAL_TABLET | ORAL | 0 refills | Status: DC

## 2022-06-27 NOTE — Progress Notes (Signed)
My back is starting to hurt again.  She had epidural about six weeks ago or so.  She did well until last week and her pain is returning.  She has no new trauma.  Lower back is tender in the mid portion. No spasm is present.  ROM is good.  NV intact.  SLR negative.  Muscle tone and strength normal.  Encounter Diagnosis  Name Primary?   Lumbar pain with radiation down left leg Yes   I will have her call and see if she can get new epidural.  I will refill her pain medicine.  Continue her exercises.  I have reviewed the [place] Controlled Substance Reporting System web site prior to prescribing narcotic medicine for this patient.  She is applying for SSI.  Call if any problem.  Precautions discussed.  Return in one month.  Electronically Signed Darreld Mclean, MD 6/4/202410:19 AM

## 2022-06-27 NOTE — Telephone Encounter (Signed)
ERROR

## 2022-07-20 ENCOUNTER — Ambulatory Visit: Payer: BC Managed Care – PPO

## 2022-07-24 NOTE — Patient Instructions (Incomplete)
Our records indicate that you are due for your screening mammogram.  Please call the imaging center that does your yearly mammograms to make an appointment for a mammogram at your earliest convenience. Our office also has a mobile unit through the [facility] that comes to our location. Please call our office if you would like to make an appointment.   

## 2022-07-25 ENCOUNTER — Encounter: Payer: Self-pay | Admitting: Orthopaedic Surgery

## 2022-07-25 ENCOUNTER — Encounter: Payer: Self-pay | Admitting: Family Medicine

## 2022-07-25 ENCOUNTER — Ambulatory Visit: Payer: BC Managed Care – PPO | Admitting: Family Medicine

## 2022-07-25 ENCOUNTER — Ambulatory Visit (INDEPENDENT_AMBULATORY_CARE_PROVIDER_SITE_OTHER): Payer: BC Managed Care – PPO | Admitting: Orthopaedic Surgery

## 2022-07-25 ENCOUNTER — Ambulatory Visit: Payer: BC Managed Care – PPO | Admitting: Orthopaedic Surgery

## 2022-07-25 VITALS — BP 127/74 | HR 94 | Temp 97.8°F | Ht 68.0 in | Wt 200.0 lb

## 2022-07-25 VITALS — BP 103/71 | HR 99 | Ht 68.0 in | Wt 201.0 lb

## 2022-07-25 DIAGNOSIS — E1169 Type 2 diabetes mellitus with other specified complication: Secondary | ICD-10-CM | POA: Diagnosis not present

## 2022-07-25 DIAGNOSIS — G4452 New daily persistent headache (NDPH): Secondary | ICD-10-CM | POA: Insufficient documentation

## 2022-07-25 DIAGNOSIS — E785 Hyperlipidemia, unspecified: Secondary | ICD-10-CM

## 2022-07-25 DIAGNOSIS — E1165 Type 2 diabetes mellitus with hyperglycemia: Secondary | ICD-10-CM | POA: Diagnosis not present

## 2022-07-25 DIAGNOSIS — M545 Low back pain, unspecified: Secondary | ICD-10-CM

## 2022-07-25 DIAGNOSIS — R748 Abnormal levels of other serum enzymes: Secondary | ICD-10-CM

## 2022-07-25 DIAGNOSIS — G8929 Other chronic pain: Secondary | ICD-10-CM

## 2022-07-25 DIAGNOSIS — M79605 Pain in left leg: Secondary | ICD-10-CM | POA: Diagnosis not present

## 2022-07-25 DIAGNOSIS — Z7985 Long-term (current) use of injectable non-insulin antidiabetic drugs: Secondary | ICD-10-CM

## 2022-07-25 LAB — BAYER DCA HB A1C WAIVED: HB A1C (BAYER DCA - WAIVED): 9.8 % — ABNORMAL HIGH (ref 4.8–5.6)

## 2022-07-25 MED ORDER — TRULICITY 4.5 MG/0.5ML ~~LOC~~ SOAJ
4.5000 mg | SUBCUTANEOUS | 2 refills | Status: DC
Start: 2022-07-25 — End: 2022-10-10

## 2022-07-25 MED ORDER — HYDROCODONE-ACETAMINOPHEN 5-325 MG PO TABS: ORAL_TABLET | ORAL | 0 refills | Status: DC

## 2022-07-25 NOTE — Progress Notes (Signed)
I am no better.  She still has lower back pain.  She has been doing her exercises.  She is not working.  They cannot do another epidural until next month.  Spine/Pelvis examination:  Inspection:  Overall, sacoiliac joint benign and hips nontender; without crepitus or defects.   Thoracic spine inspection: Alignment normal without kyphosis present   Lumbar spine inspection:  Alignment  with normal lumbar lordosis, without scoliosis apparent.   Thoracic spine palpation:  without tenderness of spinal processes   Lumbar spine palpation: without tenderness of lumbar area; without tightness of lumbar muscles    Range of Motion:   Lumbar flexion, forward flexion is normal without pain or tenderness    Lumbar extension is full without pain or tenderness   Left lateral bend is normal without pain or tenderness   Right lateral bend is normal without pain or tenderness   Straight leg raising is normal  Strength & tone: normal   Stability overall normal stability  Encounter Diagnosis  Name Primary?   Lumbar pain with radiation down left leg Yes   Stay out of work.  I filled out disability form.  Return in two months.  Get epidural next month.  I have reviewed the [place] Controlled Substance Reporting System web site prior to prescribing narcotic medicine for this patient.  Call if any problem.  Precautions discussed.  Electronically Signed Darreld Mclean, MD 7/2/20241:49 PM

## 2022-07-25 NOTE — Progress Notes (Addendum)
Subjective:  Patient ID: Catherine Chapman, female    DOB: Oct 02, 1960, 62 y.o.   MRN: [id number]  Patient Care Team: Sonny Masters, FNP as PCP - General (Family Medicine) Michaelle Copas, MD as Referring Physician (Optometry)   Chief Complaint:  Diabetes (3 month follow up )   HPI: Catherine Chapman is a 62 y.o. female presenting on 07/25/2022 for Diabetes (3 month follow up )   Diabetes She presents for her follow-up diabetic visit. She has type 2 diabetes mellitus. No MedicAlert identification noted. The initial diagnosis of diabetes was made 2 years ago. Her disease course has been improving. Hypoglycemia symptoms include headaches (3 weks 7/10 today and worse right now). Pertinent negatives for hypoglycemia include no confusion, dizziness, hunger, mood changes, nervousness/anxiousness, pallor, seizures, sleepiness, speech difficulty, sweats or tremors. Associated symptoms include foot paresthesias (new since last visit associated with anklse and feet swelling). Pertinent negatives for diabetes include no blurred vision, no chest pain, no fatigue, no foot ulcerations, no polydipsia, no polyphagia, no polyuria, no weakness and no weight loss. There are no hypoglycemic complications. Diabetic complications include heart disease. Risk factors for coronary artery disease include diabetes mellitus, hypertension and dyslipidemia. When asked about current treatments, none were reported. She is following a diabetic diet. She has not had a previous visit with a dietitian. She participates in exercise intermittently. There is no change in her home blood glucose trend. Her breakfast blood glucose range is generally 110-130 mg/dl. An ACE inhibitor/angiotensin II receptor blocker is being taken. She does not see a podiatrist.Eye exam is not current.   1. Type 2 diabetes mellitus with hyperglycemia, without long-term current use of insulin (HCC) Pt presents for follow up evaluation of Type 2 diabetes mellitus.   Current symptoms include none. Patient denies  none . Current diabetic medications include Trulicity, insulin jardiance Compliant with meds - Yes Current monitoring regimen: home blood tests - daily Any episodes of hypoglycemia? no Known diabetic complications: none Cardiovascular risk factors: diabetes mellitus, dyslipidemia, and hypertension Eye exam current (within one year): yes Podiatry yearly?  No Weight trend: decreasing steadily Current diet: in general, a "healthy" diet   Current exercise: none Is She on ACE inhibitor or angiotensin II receptor blocker?  Yes, elnapril Is She on statin? Yes lipitor Is She on ASA 81 mg daily?  Yes   2. Hyperlipidemia associated with type 2 diabetes mellitus (HCC) Complaint with meds - Yes Current Medications - lipitor Exercising Regularly - No Watching Salt intake - No Pertinent ROS:  Headache - Yes Fatigue - Yes Visual Disturbances - no Chest pain - no Dyspnea - no Palpitations - No LE edema - Yes They report good compliance with medications and can restate their regimen by memory. No medication side effects.  BP Readings from Last 3 Encounters:  07/25/22 127/74  06/27/22 116/71  05/30/22 131/81    3. Back pain  Being followed by Dr Hilda Lias and has follow up appointment this afternoon. Pt states it reduces her ability to do ADLs pain over lower back. Pt states oxycodone and tylenol works well on pain but states she has been out of oxycodone for 2 days  4. Headache 7/10 headache. Denies light sensitivity  dull ache hasn't taken any tylenol for it today. States has ben more frequent over last several weeks. Pt states it hurts to walk stand and sit for any length of time. Pt states oxycodone and tylenol works well on pain but states  she has been out of oxy for 2 days. And didn't take tylenol today.   Relevant past medical, surgical, family, and social history reviewed and updated as indicated.  Allergies and medications reviewed and  updated. Data reviewed: Chart in Epic.   Past Medical History:  Diagnosis Date   Aortic atherosclerosis (HCC)    Back pain    Diabetes (HCC)    Mediastinal lymphadenopathy 03/06/2021   Chest CT at [facility]   Pulmonary nodule 03/06/2021   Chest CT at [facility]    Past Surgical History:  Procedure Laterality Date   I & D EXTREMITY Left 01/17/2017   Procedure: IRRIGATION AND DEBRIBEMENT WITH OPEN REDUCTION INTERNAL FIXATION LEFT INDEX FINGER WITH EXTENSOR TENDON REPAIR;  Surgeon: Dairl Ponder, MD;  Location: [facility] OR;  Service: Orthopedics;  Laterality: Left;   KNEE SURGERY     KNEE SURGERY     ROTATOR CUFF REPAIR      Social History   Socioeconomic History   Marital status: Single    Spouse name: Not on file   Number of children: Not on file   Years of education: Not on file   Highest education level: Not on file  Occupational History   Not on file  Tobacco Use   Smoking status: Former    Types: Cigarettes    Quit date: 43    Years since quitting: 34.5   Smokeless tobacco: Never  Vaping Use   Vaping Use: Never used  Substance and Sexual Activity   Alcohol use: Yes    Comment: occ   Drug use: No   Sexual activity: Yes    Birth control/protection: None  Other Topics Concern   Not on file  Social History Narrative   Not on file   Social Determinants of Health   Financial Resource Strain: Not on file  Food Insecurity: Not on file  Transportation Needs: Not on file  Physical Activity: Not on file  Stress: Not on file  Social Connections: Not on file  Intimate Partner Violence: Not on file    Outpatient Encounter Medications as of 07/25/2022  Medication Sig   Dulaglutide (TRULICITY) 4.5 MG/0.5ML SOPN Inject 4.5 mg as directed once a week.   aspirin 81 MG EC tablet Take 1 tablet (81 mg total) by mouth daily. Swallow whole.   atorvastatin (LIPITOR) 10 MG tablet Take 1 tablet (10 mg total) by mouth daily.   Continuous Blood Gluc Sensor (FREESTYLE  LIBRE 3 SENSOR) MISC 1 Device by Does not apply route every 14 (fourteen) days. Place 1 sensor on the skin every 14 days. Use to check glucose continuously   empagliflozin (JARDIANCE) 10 MG TABS tablet Take 1 tablet (10 mg total) by mouth daily before breakfast.   enalapril (VASOTEC) 2.5 MG tablet Take 1 tablet (2.5 mg total) by mouth daily.   HYDROcodone-acetaminophen (NORCO/VICODIN) 5-325 MG tablet One tablet every six hours for pain.  Limit 7 days.   Insulin Pen Needle 32G X 4 MM MISC Use with insulin daily Dx E11.69   polyethylene glycol powder (GLYCOLAX/MIRALAX) 17 GM/SCOOP powder Take 17 g by mouth daily.   [DISCONTINUED] Dulaglutide (TRULICITY) 3 MG/0.5ML SOPN Inject 3 mg as directed once a week.   No facility-administered encounter medications on file as of 07/25/2022.    Allergies  Allergen Reactions   Tramadol Nausea And Vomiting   Metformin Other (See Comments)    Headaches   Metformin And Related Other (See Comments)    Headaches  Review of Systems  Constitutional:  Negative for activity change, appetite change, chills, diaphoresis, fatigue, fever, unexpected weight change and weight loss.  HENT: Negative.    Eyes: Negative.  Negative for blurred vision, photophobia and visual disturbance.  Respiratory: Negative.  Negative for cough and shortness of breath.   Cardiovascular:  Negative for chest pain, palpitations and leg swelling.  Endocrine: Negative for polydipsia, polyphagia and polyuria.  Genitourinary: Negative.  Negative for decreased urine volume and difficulty urinating.  Musculoskeletal:  Positive for back pain and joint swelling. Negative for myalgias, neck pain and neck stiffness.  Skin:  Negative for pallor.  Neurological:  Positive for headaches (3 weks 7/10 today and worse right now). Negative for dizziness, tremors, seizures, syncope, facial asymmetry, speech difficulty, weakness, light-headedness and numbness.  Hematological: Negative.    Psychiatric/Behavioral:  Positive for self-injury. Negative for agitation, confusion and sleep disturbance. The patient is not nervous/anxious.   All other systems reviewed and are negative.       Objective:  BP 127/74   Pulse 94   Temp 97.8 F (36.6 C) (Temporal)   Ht 5\' 8"  (1.727 m)   Wt 200 lb (90.7 kg)   SpO2 100%   BMI 30.41 kg/m    Wt Readings from Last 3 Encounters:  07/25/22 200 lb (90.7 kg)  06/27/22 210 lb (95.3 kg)  05/30/22 210 lb (95.3 kg)    Physical Exam Vitals and nursing note reviewed.  Constitutional:      Appearance: She is obese.  HENT:     Head: Normocephalic and atraumatic.     Right Ear: Tympanic membrane normal.     Left Ear: Tympanic membrane normal.     Nose: Nose normal.     Mouth/Throat:     Mouth: Mucous membranes are moist.  Eyes:     Conjunctiva/sclera: Conjunctivae normal.     Pupils: Pupils are equal, round, and reactive to light.  Cardiovascular:     Rate and Rhythm: Normal rate and regular rhythm.     Pulses: Normal pulses.     Heart sounds: Normal heart sounds.  Pulmonary:     Effort: Pulmonary effort is normal.  Abdominal:     General: Bowel sounds are normal.  Genitourinary:    General: Normal vulva.  Musculoskeletal:        General: Normal range of motion.     Cervical back: Normal range of motion.  Skin:    General: Skin is warm and dry.     Capillary Refill: Capillary refill takes less than 2 seconds.  Neurological:     General: No focal deficit present.     Mental Status: She is alert and oriented to person, place, and time.  Psychiatric:        Mood and Affect: Mood normal.        Behavior: Behavior normal.        Thought Content: Thought content normal.        Judgment: Judgment normal.     Results for orders placed or performed in visit on 04/12/22  Lipid panel  Result Value Ref Range   Cholesterol, Total 162 100 - 199 mg/dL   Triglycerides 161 (H) 0 - 149 mg/dL   HDL 50 >09 mg/dL   VLDL Cholesterol  Cal 34 5 - 40 mg/dL   LDL Chol Calc (NIH) 78 0 - 99 mg/dL   Chol/HDL Ratio 3.2 0.0 - 4.4 ratio  CMP14+EGFR  Result Value Ref Range   Glucose 339 (H)  70 - 99 mg/dL   BUN 15 8 - 27 mg/dL   Creatinine, Ser 1.61 0.57 - 1.00 mg/dL   eGFR 70 >09 UE/AVW/0.98   BUN/Creatinine Ratio 16 12 - 28   Sodium 135 134 - 144 mmol/L   Potassium 3.9 3.5 - 5.2 mmol/L   Chloride 97 96 - 106 mmol/L   CO2 21 20 - 29 mmol/L   Calcium 9.1 8.7 - 10.3 mg/dL   Total Protein 7.3 6.0 - 8.5 g/dL   Albumin 4.1 3.9 - 4.9 g/dL   Globulin, Total 3.2 1.5 - 4.5 g/dL   Albumin/Globulin Ratio 1.3 1.2 - 2.2   Bilirubin Total 0.4 0.0 - 1.2 mg/dL   Alkaline Phosphatase 151 (H) 44 - 121 IU/L   AST 29 0 - 40 IU/L   ALT 37 (H) 0 - 32 IU/L  Bayer DCA Hb A1c Waived  Result Value Ref Range   HB A1C (BAYER DCA - WAIVED) 12.6 (H) 4.8 - 5.6 %       Pertinent labs & imaging results that were available during my care of the patient were reviewed by me and considered in my medical decision making.  Assessment & Plan:  Catherine Chapman was seen today for diabetes.  Diagnoses and all orders for this visit:  Type 2 diabetes mellitus with hyperglycemia, without long-term current use of insulin (HCC) A1C 9.8 today, increase Trulicity to 4.5 mg weekly, continue with diet and exercise changes.  -     Bayer DCA Hb A1c Waived -     Dulaglutide (TRULICITY) 4.5 MG/0.5ML SOPN; Inject 4.5 mg as directed once a week.  Hyperlipidemia associated with type 2 diabetes mellitus (HCC) Diet encouraged - increase intake of fresh fruits and vegetables, increase intake of lean proteins. Bake, broil, or grill foods. Avoid fried, greasy, and fatty foods. Avoid fast foods. Increase intake of fiber-rich whole grains. Exercise encouraged - at least 150 minutes per week and advance as tolerated.  Goal BMI < 25. Continue medications as prescribed. Follow up in 3-6 months as discussed.  -     Lipid panel -     CMP14+EGFR  Chronic midline low back pain, unspecified  whether sciatica present -     Continue Medications prescribed by Dr Hilda Lias -     keep follow up with Dr Hilda Lias this afternoon  New daily persistent headache -     Start and maintain headache log will give hardcopy of blank log in discharge papers -     OTC Excedrin tension.1 tablet BID PRN  Continue all other maintenance medications.  Follow up plan: Return in about 3 months (around 10/25/2022) for DM. Pt educated to return to clinic if headache doesn't improve or if headache increases to "worst headache of your life" call 911 for emergency evaluation  Continue healthy lifestyle choices, including diet (rich in fruits, vegetables, and lean proteins, and low in salt and simple carbohydrates) and exercise (at least 30 minutes of moderate physical activity daily)  Educational handout given for dash   The above assessment and management plan was discussed with the patient. The patient verbalized understanding of and has agreed to the management plan. Patient is aware to call the clinic if they develop any new symptoms or if symptoms persist or worsen. Patient is aware when to return to the clinic for a follow-up visit. Patient educated on when it is appropriate to go to the emergency department.   Maryelizabeth Kaufmann NP student [facility] [phone]  I  personally was present during the history, physical exam, and medical decision-making activities of this visit and have verified that the services and findings are accurately documented in the nurse practitioner student's note.  Kari Baars, FNP-C [facility] [address] [place], [place] [address] [phone]

## 2022-07-25 NOTE — Patient Instructions (Signed)
OUT OF WORK ?

## 2022-07-26 ENCOUNTER — Telehealth: Payer: Self-pay

## 2022-07-26 LAB — CMP14+EGFR
ALT: 36 IU/L — ABNORMAL HIGH (ref 0–32)
AST: 31 IU/L (ref 0–40)
Albumin: 4.5 g/dL (ref 3.9–4.9)
Alkaline Phosphatase: 148 IU/L — ABNORMAL HIGH (ref 44–121)
BUN/Creatinine Ratio: 14 (ref 12–28)
BUN: 13 mg/dL (ref 8–27)
Bilirubin Total: 0.5 mg/dL (ref 0.0–1.2)
CO2: 20 mmol/L (ref 20–29)
Calcium: 9.8 mg/dL (ref 8.7–10.3)
Chloride: 96 mmol/L (ref 96–106)
Creatinine, Ser: 0.96 mg/dL (ref 0.57–1.00)
Globulin, Total: 3.3 g/dL (ref 1.5–4.5)
Glucose: 230 mg/dL — ABNORMAL HIGH (ref 70–99)
Potassium: 4.3 mmol/L (ref 3.5–5.2)
Sodium: 138 mmol/L (ref 134–144)
Total Protein: 7.8 g/dL (ref 6.0–8.5)
eGFR: 67 mL/min/{1.73_m2} (ref >59)

## 2022-07-26 LAB — LIPID PANEL
Chol/HDL Ratio: 3.6 ratio (ref 0.0–4.4)
Cholesterol, Total: 179 mg/dL (ref 100–199)
HDL: 50 mg/dL (ref >39)
LDL Chol Calc (NIH): 108 mg/dL — ABNORMAL HIGH (ref 0–99)
Triglycerides: 116 mg/dL (ref 0–149)
VLDL Cholesterol Cal: 21 mg/dL (ref 5–40)

## 2022-07-26 NOTE — Telephone Encounter (Signed)
Catherine Chapman (Key: [id number]) PA Case ID #: [id number] Rx #: [id number] Need Help? Call us at [phone] Status sent iconSent to Plan today Drug Trulicity 4.5MG /0.5ML pen-injectors ePA cloud Optician, dispensing PA Form [phone] NCPDP) Original Claim Info 75 PA REQUIREDDRUG REQUIRES PRIOR AUTHORIZATION(PHARMACY HELP DESK [phone])

## 2022-07-26 NOTE — Addendum Note (Signed)
Addended by: Sonny Masters on: 07/26/2022 05:05 PM   Modules accepted: Orders

## 2022-07-28 ENCOUNTER — Other Ambulatory Visit (HOSPITAL_COMMUNITY): Payer: Self-pay

## 2022-07-28 NOTE — Telephone Encounter (Signed)
Patient Advocate Encounter  Prior Authorization for Trulicity 4.5MG /0.5ML pen-injectors has been approved with [employer].    PA# [id number] Effective dates: 07/27/22 through 07/27/23  Per [facility] test claim, copay for 28 days supply is $0

## 2022-08-08 ENCOUNTER — Telehealth: Payer: BC Managed Care – PPO | Admitting: Pharmacist

## 2022-08-10 ENCOUNTER — Ambulatory Visit (HOSPITAL_COMMUNITY)
Admission: RE | Admit: 2022-08-10 | Discharge: 2022-08-10 | Disposition: A | Discharge status: Home / Self Care | Payer: 59 | Source: Ambulatory Visit | Attending: Family Medicine | Admitting: Family Medicine

## 2022-08-10 DIAGNOSIS — R748 Abnormal levels of other serum enzymes: Secondary | ICD-10-CM | POA: Insufficient documentation

## 2022-08-23 ENCOUNTER — Telehealth: Payer: Self-pay | Admitting: Orthopaedic Surgery

## 2022-09-19 ENCOUNTER — Ambulatory Visit: Payer: BC Managed Care – PPO | Admitting: Orthopaedic Surgery

## 2022-09-20 ENCOUNTER — Ambulatory Visit: Payer: 59 | Admitting: Orthopaedic Surgery

## 2022-09-20 ENCOUNTER — Encounter: Payer: Self-pay | Admitting: Orthopaedic Surgery

## 2022-09-20 VITALS — BP 123/58 | HR 68 | Ht 68.0 in | Wt 195.0 lb

## 2022-09-20 DIAGNOSIS — M545 Low back pain, unspecified: Secondary | ICD-10-CM

## 2022-09-20 DIAGNOSIS — M79605 Pain in left leg: Secondary | ICD-10-CM | POA: Diagnosis not present

## 2022-09-20 NOTE — Progress Notes (Signed)
My back is still hurting.  She was to have epidural of the lumbar spine but had insurance problems.  It is rescheduled for 09-28-22.  She still has lower back pain.  She has left sided paresthesias.  She has no new trauma.  Spine/Pelvis examination:  Inspection:  Overall, sacoiliac joint benign and hips nontender; without crepitus or defects.   Thoracic spine inspection: Alignment normal without kyphosis present   Lumbar spine inspection:  Alignment  with normal lumbar lordosis, without scoliosis apparent.   Thoracic spine palpation:  without tenderness of spinal processes   Lumbar spine palpation: without tenderness of lumbar area; without tightness of lumbar muscles    Range of Motion:   Lumbar flexion, forward flexion is normal without pain or tenderness    Lumbar extension is full without pain or tenderness   Left lateral bend is normal without pain or tenderness   Right lateral bend is normal without pain or tenderness   Straight leg raising is normal  Strength & tone: normal   Stability overall normal stability  Encounter Diagnosis  Name Primary?   Lumbar pain with radiation down left leg Yes   Stay out of work.  Forms completed.  Get the epidural.  Return in one month.  Call if any problem.  Precautions discussed.  Electronically Signed Darreld Mclean, MD 8/28/20248:55 AM

## 2022-09-26 ENCOUNTER — Telehealth: Payer: Self-pay | Admitting: Orthopaedic Surgery

## 2022-10-03 ENCOUNTER — Telehealth: Payer: Self-pay

## 2022-10-03 NOTE — Telephone Encounter (Signed)
Patient has not felt well last few days, has had headache and generally just not feeling good.  Checked blood sugar today and meter would not read.  Patient called EMS who came out and checked blood sugar and it was 400.  She took the Trulicity injection last Thursday.  She has not taken Jardiance in months because she cannot afford it.  I spoke with Stu at [facility] where it was called in to and he said she has never picked it up, the cost was $140.98 for a 30 day supply.  Her insurance has a high deductible he said so her medications are expensive.  Patient unsure what to do, please advise.

## 2022-10-04 NOTE — Telephone Encounter (Signed)
Appt scheduled for 10/10/22 with Catherine Chapman

## 2022-10-04 NOTE — Telephone Encounter (Signed)
She needs to come in for an appointment so we can get her on medications that she can afford.

## 2022-10-04 NOTE — Telephone Encounter (Signed)
Lmtcb.

## 2022-10-04 NOTE — Telephone Encounter (Signed)
If pt calls back please schedule her an appt with Marcelino Duster for next available

## 2022-10-10 ENCOUNTER — Ambulatory Visit: Payer: BC Managed Care – PPO | Admitting: Family Medicine

## 2022-10-10 ENCOUNTER — Encounter: Payer: Self-pay | Admitting: Family Medicine

## 2022-10-10 DIAGNOSIS — E1165 Type 2 diabetes mellitus with hyperglycemia: Secondary | ICD-10-CM

## 2022-10-10 DIAGNOSIS — Z7984 Long term (current) use of oral hypoglycemic drugs: Secondary | ICD-10-CM

## 2022-10-10 LAB — BAYER DCA HB A1C WAIVED: HB A1C (BAYER DCA - WAIVED): 8.4 % — ABNORMAL HIGH (ref 4.8–5.6)

## 2022-10-10 MED ORDER — ENALAPRIL MALEATE 2.5 MG PO TABS
2.5000 mg | ORAL_TABLET | Freq: Every day | ORAL | 1 refills | Status: DC
Start: 2022-10-10 — End: 2023-08-21

## 2022-10-10 MED ORDER — TRULICITY 4.5 MG/0.5ML ~~LOC~~ SOAJ
4.5000 mg | SUBCUTANEOUS | 2 refills | Status: DC
Start: 2022-10-10 — End: 2023-05-15

## 2022-10-10 MED ORDER — EMPAGLIFLOZIN 10 MG PO TABS
10.0000 mg | ORAL_TABLET | Freq: Every day | ORAL | 3 refills | Status: DC
Start: 2022-10-10 — End: 2023-10-30

## 2022-10-10 NOTE — Progress Notes (Signed)
Subjective:  Patient ID: Catherine Chapman, female    DOB: 02/25/1960, 62 y.o.   MRN: [id number]  Patient Care Team: Sonny Masters, FNP as PCP - General (Family Medicine) Michaelle Copas, MD as Referring Physician (Optometry)   Chief Complaint:  Hyperglycemia (Patient states that when she started taking her steroid back injection. States her BS was so high that the machine said warning.  Patient states even with Jardiance her BS is 300.  Gets back injections every 3 months. )   HPI: Catherine Chapman is a 62 y.o. female presenting on 10/10/2022 for Hyperglycemia (Patient states that when she started taking her steroid back injection. States her BS was so high that the machine said warning.  Patient states even with Jardiance her BS is 300.  Gets back injections every 3 months. )    1. Type 2 diabetes mellitus with hyperglycemia, without long-term current use of insulin (HCC) Pt presents today with concerns of elevated blood sugar readings after her spinal steroid injections. She denies any polyuria, polydipsia, or polyphagia. She is taking her jardiance and Trulicity as prescribed and tolerating well.      Relevant past medical, surgical, family, and social history reviewed and updated as indicated.  Allergies and medications reviewed and updated. Data reviewed: Chart in Epic.   Past Medical History:  Diagnosis Date   Aortic atherosclerosis (HCC)    Back pain    Diabetes (HCC)    Mediastinal lymphadenopathy 03/06/2021   Chest CT at [facility]   Pulmonary nodule 03/06/2021   Chest CT at [facility]    Past Surgical History:  Procedure Laterality Date   I & D EXTREMITY Left 01/17/2017   Procedure: IRRIGATION AND DEBRIBEMENT WITH OPEN REDUCTION INTERNAL FIXATION LEFT INDEX FINGER WITH EXTENSOR TENDON REPAIR;  Surgeon: Dairl Ponder, MD;  Location: [facility] OR;  Service: Orthopedics;  Laterality: Left;   KNEE SURGERY     KNEE SURGERY     ROTATOR CUFF REPAIR      Social  History   Socioeconomic History   Marital status: Single    Spouse name: Not on file   Number of children: Not on file   Years of education: Not on file   Highest education level: Not on file  Occupational History   Not on file  Tobacco Use   Smoking status: Former    Current packs/day: 0.00    Types: Cigarettes    Quit date: 65    Years since quitting: 34.7   Smokeless tobacco: Never  Vaping Use   Vaping status: Never Used  Substance and Sexual Activity   Alcohol use: Yes    Comment: occ   Drug use: No   Sexual activity: Yes    Birth control/protection: None  Other Topics Concern   Not on file  Social History Narrative   Not on file   Social Determinants of Health   Financial Resource Strain: Not on File (12/25/2019)   Received from [employer], [employer] [place]    Financial Resource Strain: 0  Food Insecurity: Not on file (10/02/2022)  Transportation Needs: Not on File (12/25/2019)   Received from [place], [employer] Needs    Transportation: 0  Physical Activity: Not on File (12/25/2019)   Received from [place], [place]   Physical Activity    Physical Activity: 0  Stress: Not on File (12/25/2019)   Received from [facility], [place]   Stress    Stress:  0  Social Connections: Not on File (10/02/2022)   Received from [facility]   Social Connections    Connectedness: 0  Intimate Partner Violence: Not on file    Outpatient Encounter Medications as of 10/10/2022  Medication Sig   aspirin 81 MG EC tablet Take 1 tablet (81 mg total) by mouth daily. Swallow whole.   atorvastatin (LIPITOR) 10 MG tablet Take 1 tablet (10 mg total) by mouth daily.   Continuous Blood Gluc Sensor (FREESTYLE LIBRE 3 SENSOR) MISC 1 Device by Does not apply route every 14 (fourteen) days. Place 1 sensor on the skin every 14 days. Use to check glucose continuously   HYDROcodone-acetaminophen (NORCO/VICODIN) 5-325 MG tablet TAKE ONE TABLET EVERY 6 HOURS AS NEEDED FOR PAIN   Insulin Pen  Needle 32G X 4 MM MISC Use with insulin daily Dx E11.69   polyethylene glycol powder (GLYCOLAX/MIRALAX) 17 GM/SCOOP powder Take 17 g by mouth daily.   [DISCONTINUED] Dulaglutide (TRULICITY) 4.5 MG/0.5ML SOPN Inject 4.5 mg as directed once a week.   [DISCONTINUED] empagliflozin (JARDIANCE) 10 MG TABS tablet Take 1 tablet (10 mg total) by mouth daily before breakfast.   [DISCONTINUED] enalapril (VASOTEC) 2.5 MG tablet Take 1 tablet (2.5 mg total) by mouth daily.   Dulaglutide (TRULICITY) 4.5 MG/0.5ML SOPN Inject 4.5 mg as directed once a week.   empagliflozin (JARDIANCE) 10 MG TABS tablet Take 1 tablet (10 mg total) by mouth daily before breakfast.   enalapril (VASOTEC) 2.5 MG tablet Take 1 tablet (2.5 mg total) by mouth daily.   No facility-administered encounter medications on file as of 10/10/2022.    Allergies  Allergen Reactions   Tramadol Nausea And Vomiting   Metformin Other (See Comments)    Headaches   Metformin And Related Other (See Comments)    Headaches    Review of Systems  Constitutional:  Negative for activity change, appetite change, chills, diaphoresis, fatigue, fever and unexpected weight change.  HENT: Negative.    Eyes: Negative.  Negative for photophobia and visual disturbance.  Respiratory:  Negative for cough, chest tightness and shortness of breath.   Cardiovascular:  Negative for chest pain, palpitations and leg swelling.  Gastrointestinal:  Negative for abdominal pain, blood in stool, constipation, diarrhea, nausea and vomiting.  Endocrine: Negative.  Negative for cold intolerance, heat intolerance, polydipsia, polyphagia and polyuria.  Genitourinary:  Negative for decreased urine volume, difficulty urinating, dysuria, frequency and urgency.  Musculoskeletal:  Positive for arthralgias, back pain and gait problem. Negative for myalgias.  Skin: Negative.   Allergic/Immunologic: Negative.   Neurological:  Negative for dizziness, tremors, seizures, syncope, facial  asymmetry, speech difficulty, weakness, light-headedness, numbness and headaches.  Hematological: Negative.   Psychiatric/Behavioral:  Negative for confusion, hallucinations, sleep disturbance and suicidal ideas.   All other systems reviewed and are negative.       Objective:  BP (!) 160/90   Pulse 96   Temp (!) 95.4 F (35.2 C) (Temporal)   Ht 5\' 8"  (1.727 m)   Wt 189 lb 9.6 oz (86 kg)   SpO2 96%   BMI 28.83 kg/m    Wt Readings from Last 3 Encounters:  10/10/22 189 lb 9.6 oz (86 kg)  09/20/22 195 lb (88.5 kg)  07/25/22 201 lb (91.2 kg)    Physical Exam Vitals and nursing note reviewed.  Constitutional:      General: She is not in acute distress.    Appearance: Normal appearance. She is overweight. She is not ill-appearing, toxic-appearing or diaphoretic.  HENT:  Head: Normocephalic and atraumatic.  Eyes:     Pupils: Pupils are equal, round, and reactive to light.  Cardiovascular:     Rate and Rhythm: Normal rate and regular rhythm.     Heart sounds: Normal heart sounds.  Pulmonary:     Effort: Pulmonary effort is normal.     Breath sounds: Normal breath sounds.  Musculoskeletal:     Right lower leg: No edema.     Left lower leg: No edema.  Skin:    General: Skin is warm and dry.     Capillary Refill: Capillary refill takes less than 2 seconds.  Neurological:     General: No focal deficit present.     Mental Status: She is alert and oriented to person, place, and time.  Psychiatric:        Mood and Affect: Mood normal.        Behavior: Behavior normal.        Thought Content: Thought content normal.        Judgment: Judgment normal.     Results for orders placed or performed in visit on 07/25/22  Lipid panel  Result Value Ref Range   Cholesterol, Total 179 100 - 199 mg/dL   Triglycerides 161 0 - 149 mg/dL   HDL 50 >09 mg/dL   VLDL Cholesterol Cal 21 5 - 40 mg/dL   LDL Chol Calc (NIH) 604 (H) 0 - 99 mg/dL   Chol/HDL Ratio 3.6 0.0 - 4.4 ratio   CMP14+EGFR  Result Value Ref Range   Glucose 230 (H) 70 - 99 mg/dL   BUN 13 8 - 27 mg/dL   Creatinine, Ser 5.40 0.57 - 1.00 mg/dL   eGFR 67 >98 JX/BJY/7.82   BUN/Creatinine Ratio 14 12 - 28   Sodium 138 134 - 144 mmol/L   Potassium 4.3 3.5 - 5.2 mmol/L   Chloride 96 96 - 106 mmol/L   CO2 20 20 - 29 mmol/L   Calcium 9.8 8.7 - 10.3 mg/dL   Total Protein 7.8 6.0 - 8.5 g/dL   Albumin 4.5 3.9 - 4.9 g/dL   Globulin, Total 3.3 1.5 - 4.5 g/dL   Bilirubin Total 0.5 0.0 - 1.2 mg/dL   Alkaline Phosphatase 148 (H) 44 - 121 IU/L   AST 31 0 - 40 IU/L   ALT 36 (H) 0 - 32 IU/L  Bayer DCA Hb A1c Waived  Result Value Ref Range   HB A1C (BAYER DCA - WAIVED) 9.8 (H) 4.8 - 5.6 %       Pertinent labs & imaging results that were available during my care of the patient were reviewed by me and considered in my medical decision making.  Assessment & Plan:  Addasyn was seen today for hyperglycemia.  Diagnoses and all orders for this visit:  Type 2 diabetes mellitus with hyperglycemia, without long-term current use of insulin (HCC) A1C 8.4 in office, has decreased since last A1C. Reassurance provided. Continue medications along with diet and exercise.  -     enalapril (VASOTEC) 2.5 MG tablet; Take 1 tablet (2.5 mg total) by mouth daily. -     Bayer DCA Hb A1c Waived -     empagliflozin (JARDIANCE) 10 MG TABS tablet; Take 1 tablet (10 mg total) by mouth daily before breakfast. -     Dulaglutide (TRULICITY) 4.5 MG/0.5ML SOPN; Inject 4.5 mg as directed once a week.     Continue all other maintenance medications.  Follow up plan: Return if symptoms worsen or  fail to improve.   Continue healthy lifestyle choices, including diet (rich in fruits, vegetables, and lean proteins, and low in salt and simple carbohydrates) and exercise (at least 30 minutes of moderate physical activity daily).  The above assessment and management plan was discussed with the patient. The patient verbalized understanding of  and has agreed to the management plan. Patient is aware to call the clinic if they develop any new symptoms or if symptoms persist or worsen. Patient is aware when to return to the clinic for a follow-up visit. Patient educated on when it is appropriate to go to the emergency department.   Kari Baars, FNP-C [facility] [phone]

## 2022-10-18 ENCOUNTER — Ambulatory Visit: Payer: 59 | Admitting: Orthopaedic Surgery

## 2022-10-25 ENCOUNTER — Ambulatory Visit: Payer: BC Managed Care – PPO | Admitting: Family Medicine

## 2022-10-26 ENCOUNTER — Telehealth: Payer: Self-pay | Admitting: Orthopaedic Surgery

## 2022-10-31 ENCOUNTER — Ambulatory Visit: Payer: 59 | Admitting: Orthopaedic Surgery

## 2022-11-15 ENCOUNTER — Encounter: Payer: Self-pay | Admitting: Orthopaedic Surgery

## 2022-11-15 ENCOUNTER — Ambulatory Visit (INDEPENDENT_AMBULATORY_CARE_PROVIDER_SITE_OTHER): Payer: 59 | Admitting: Orthopaedic Surgery

## 2022-11-15 DIAGNOSIS — M545 Low back pain, unspecified: Secondary | ICD-10-CM | POA: Diagnosis not present

## 2022-11-15 DIAGNOSIS — M79605 Pain in left leg: Secondary | ICD-10-CM | POA: Diagnosis not present

## 2022-11-15 NOTE — Progress Notes (Signed)
My back is a little better.  She has less lower back pain after the injection by Dr. Alvester Morin.  She is more active and doing more things.  She is doing her exercises. She is taking her medicine.  NV intact. ROM of back is good.  Muscle tone and strength is normal.   Encounter Diagnosis  Name Primary?   Lumbar pain with radiation down left leg Yes   I will see in three months.  She is to see Dr. Alvester Morin again in December.  Call if any problem.  Precautions discussed.  Electronically Signed Darreld Mclean, MD 10/23/20249:03 AM

## 2022-11-16 ENCOUNTER — Telehealth: Payer: Self-pay | Admitting: Family Medicine

## 2022-11-16 MED ORDER — ACCU-CHEK GUIDE VI STRP: ORAL_STRIP | 3 refills | Status: DC

## 2022-11-16 NOTE — Telephone Encounter (Signed)
Aware test strips sent to [facility]

## 2022-11-16 NOTE — Telephone Encounter (Signed)
  Prescription Request  11/16/2022  Is this a "Controlled Substance" medicine? Test strips for accucheck guide  Have you seen your PCP in the last 2 weeks? Next apt 01/11/2023  If YES, route message to pool  -  If NO, patient needs to be scheduled for appointment.  What is the name of the medication or equipment? Test strips for accucheck guide  Have you contacted your pharmacy to request a refill? No needs new rx   Which pharmacy would you like this sent to? [facility]  Pt says that she was prescribed Continuous Blood Gluc Sensor (FREESTYLE LIBRE 3 SENSOR) MISC  but did not use it because it did not work for her. She continued to use the test strips and now she is out.   Patient notified that their request is being sent to the clinical staff for review and that they should receive a response within 2 business days.

## 2022-11-27 ENCOUNTER — Other Ambulatory Visit: Payer: Self-pay | Admitting: Orthopaedic Surgery

## 2022-12-01 ENCOUNTER — Telehealth: Payer: Self-pay | Admitting: Orthopaedic Surgery

## 2022-12-01 NOTE — Telephone Encounter (Signed)
Dr. Sanjuan Dame pt - spoke w/the pt, she stated that the pharmacy told her that there is a hold up on her medication refill, she wants to know what is going on.  Dr. Dallas Schimke sent the Hydrocodone 5-325 to [facility] in Dr. Sanjuan Dame absence and there is an issue.  Pt's # [phone]

## 2022-12-06 NOTE — Telephone Encounter (Signed)
I called [facility] to see if they will change to 20 quantity instead of 24 so it will not trigger the prior authorization

## 2022-12-06 NOTE — Telephone Encounter (Signed)
I sent the demographics portion to cover my meds The question response is to be sent for clinicals  But system was encountering technical difficulty, will look at it tomorrow

## 2022-12-06 NOTE — Telephone Encounter (Signed)
It still triggered a prior auth so I completed it on cover my meds

## 2022-12-07 NOTE — Telephone Encounter (Signed)
Catherine Chapman is looking at it now.

## 2022-12-07 NOTE — Telephone Encounter (Signed)
I logged into cover my meds and there was a message that said Information regarding your request The receiver is not the PA processor for this patient and medication combination. Please reach out to [phone] for additional information.  It looks like the system gave [place] [employer] for the prior authorization So you will have to call them to get the approval since cover my meds could not complete it  Sorry I couldn't be more help

## 2022-12-27 ENCOUNTER — Ambulatory Visit: Payer: BC Managed Care – PPO

## 2023-01-10 ENCOUNTER — Ambulatory Visit: Payer: BC Managed Care – PPO | Admitting: Family Medicine

## 2023-01-11 ENCOUNTER — Ambulatory Visit: Payer: BC Managed Care – PPO | Admitting: Family Medicine

## 2023-01-22 ENCOUNTER — Ambulatory Visit: Payer: BC Managed Care – PPO

## 2023-01-30 ENCOUNTER — Ambulatory Visit: Payer: BC Managed Care – PPO | Admitting: Family Medicine

## 2023-02-10 IMAGING — MG MM DIGITAL SCREENING BILAT W/ TOMO AND CAD
6 of 10 series · 6 of 30 positions shown · non-contrast
Comparison: Previous exam(s).

CLINICAL DATA: Screening.

EXAM:
DIGITAL SCREENING BILATERAL MAMMOGRAM WITH TOMOSYNTHESIS AND CAD
TECHNIQUE: Bilateral screening digital craniocaudal and mediolateral oblique
mammograms were obtained. Bilateral screening digital breast
tomosynthesis was performed. The images were evaluated with
computer-aided detection.

[R CC synth-2D]
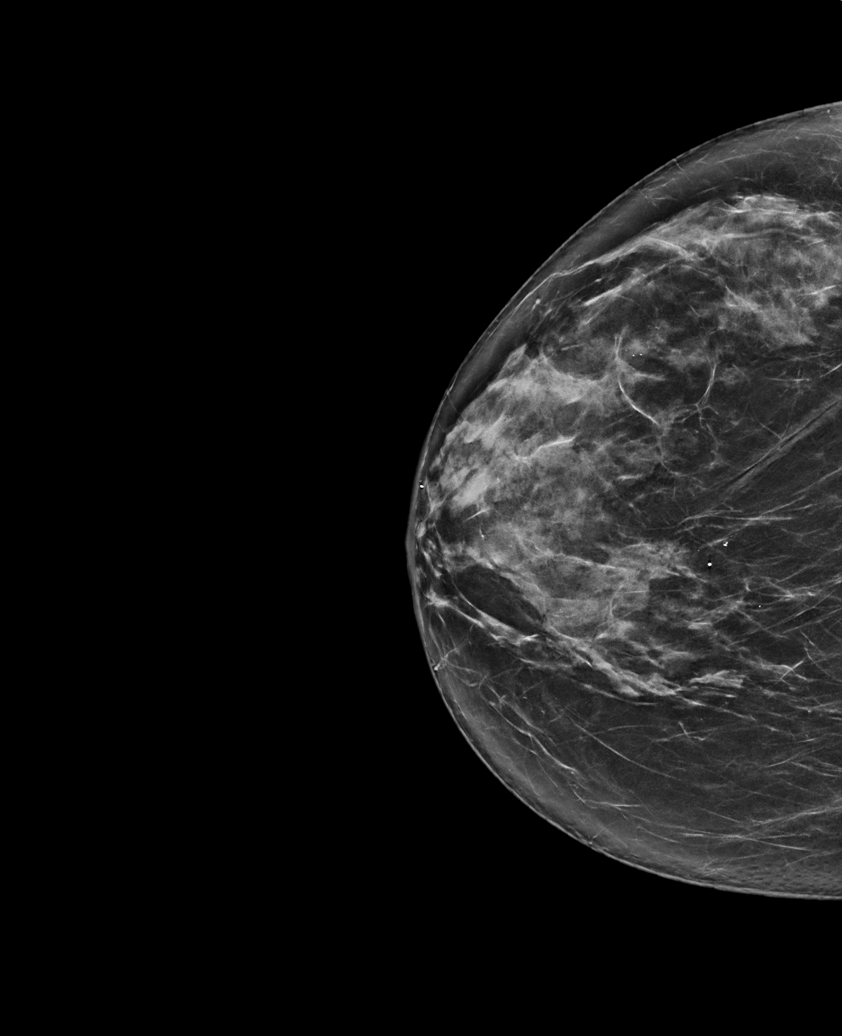

[R MLO synth-2D]
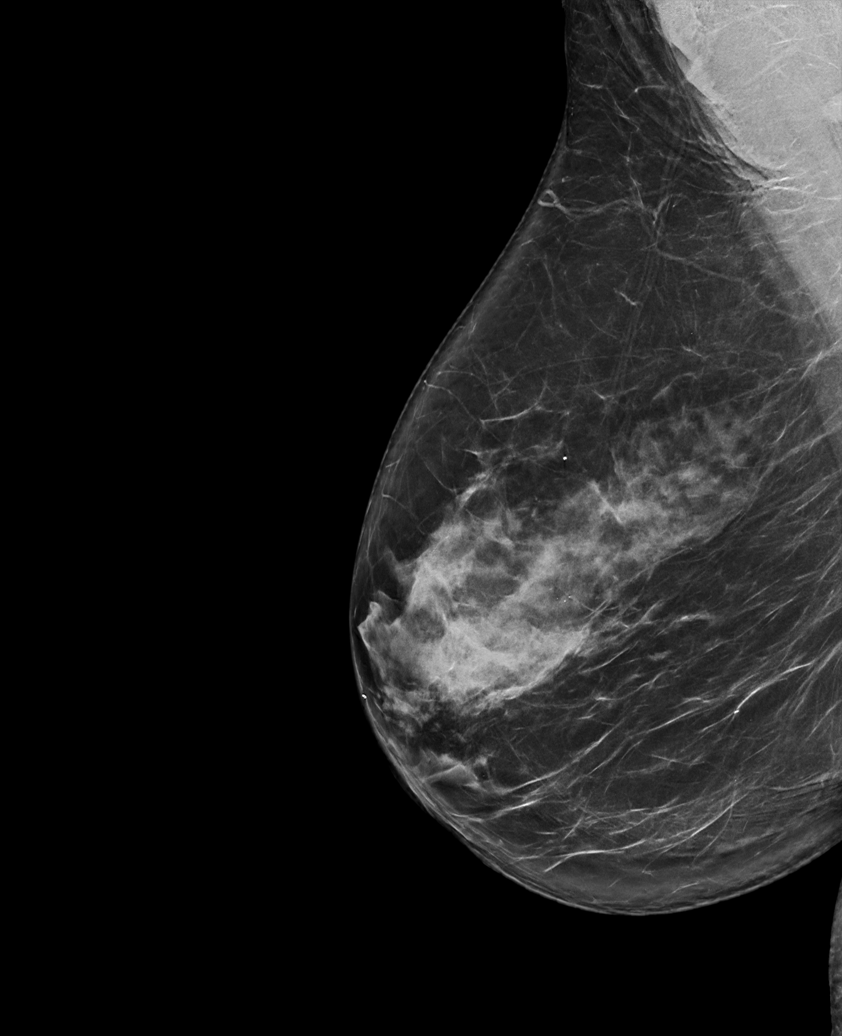

[L CC synth-2D]
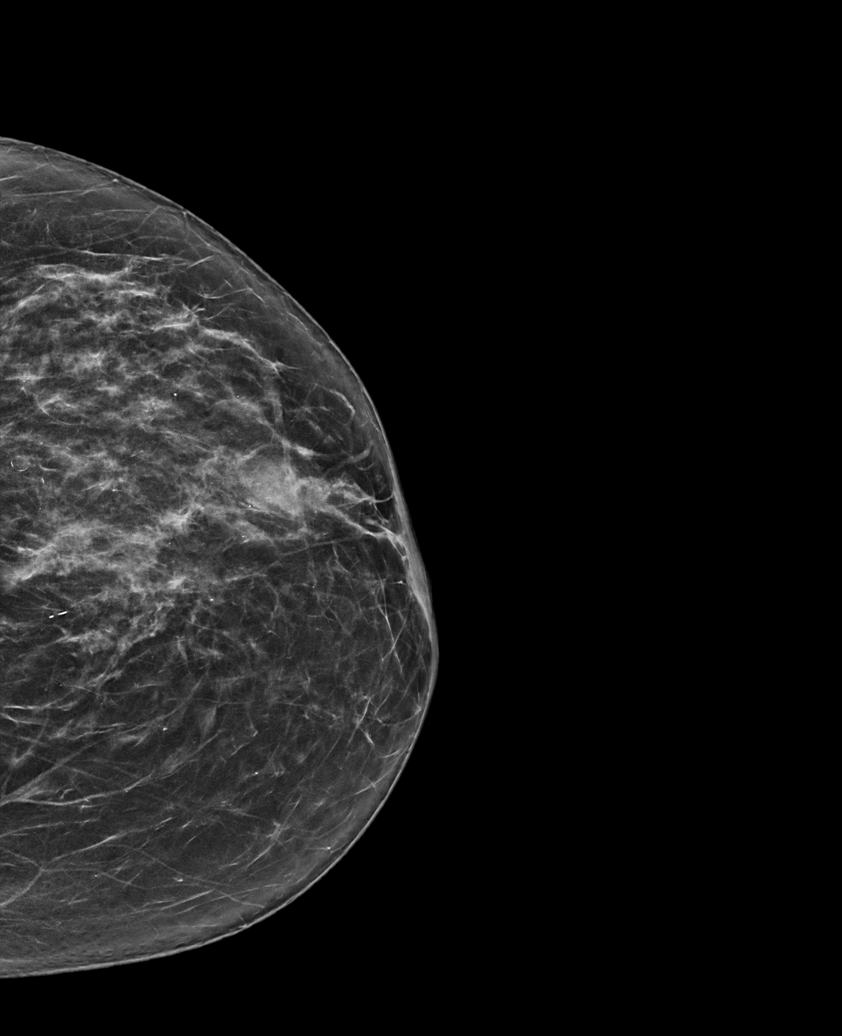

[L MLO synth-2D (1 of 2)]
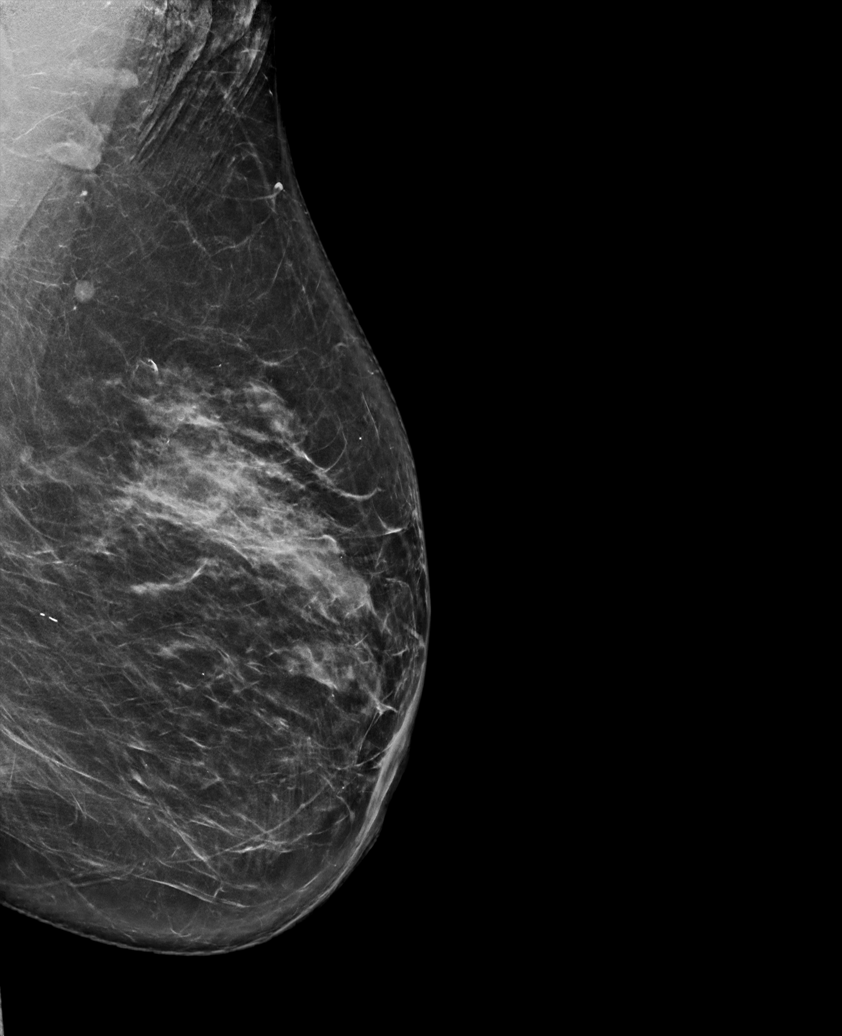

[L MLO synth-2D (2 of 2)]
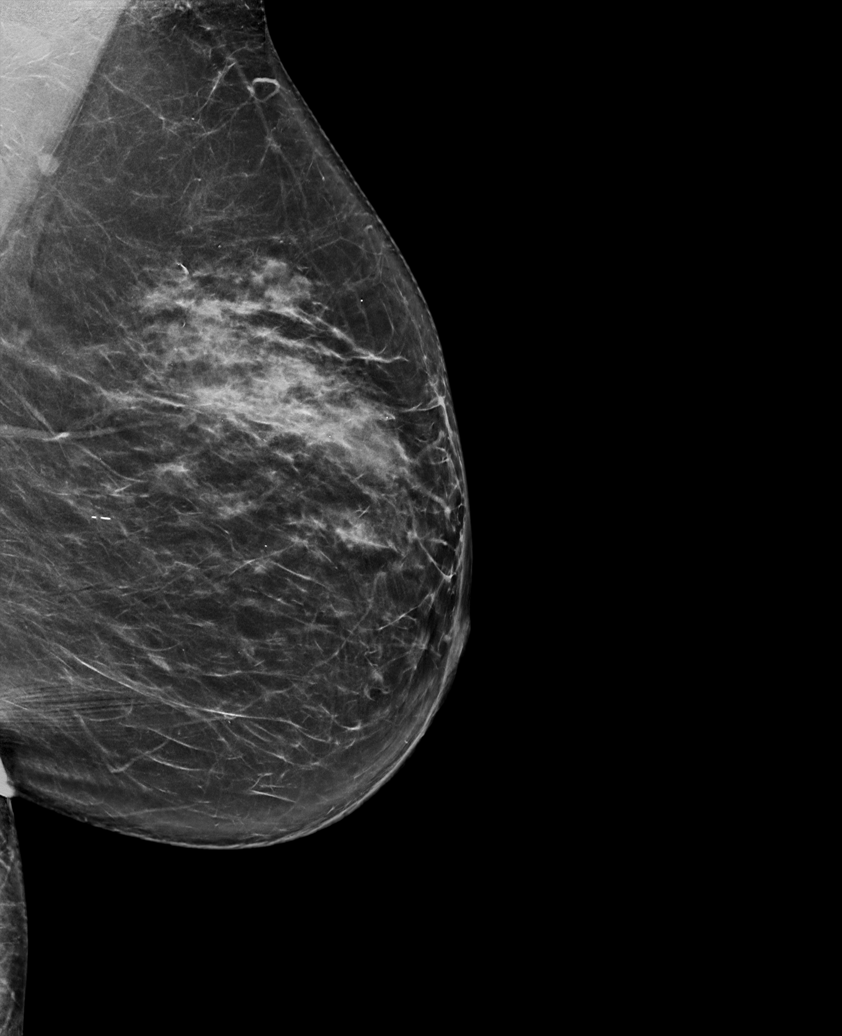

[R CC tomo · tomo slice 37/73.0]
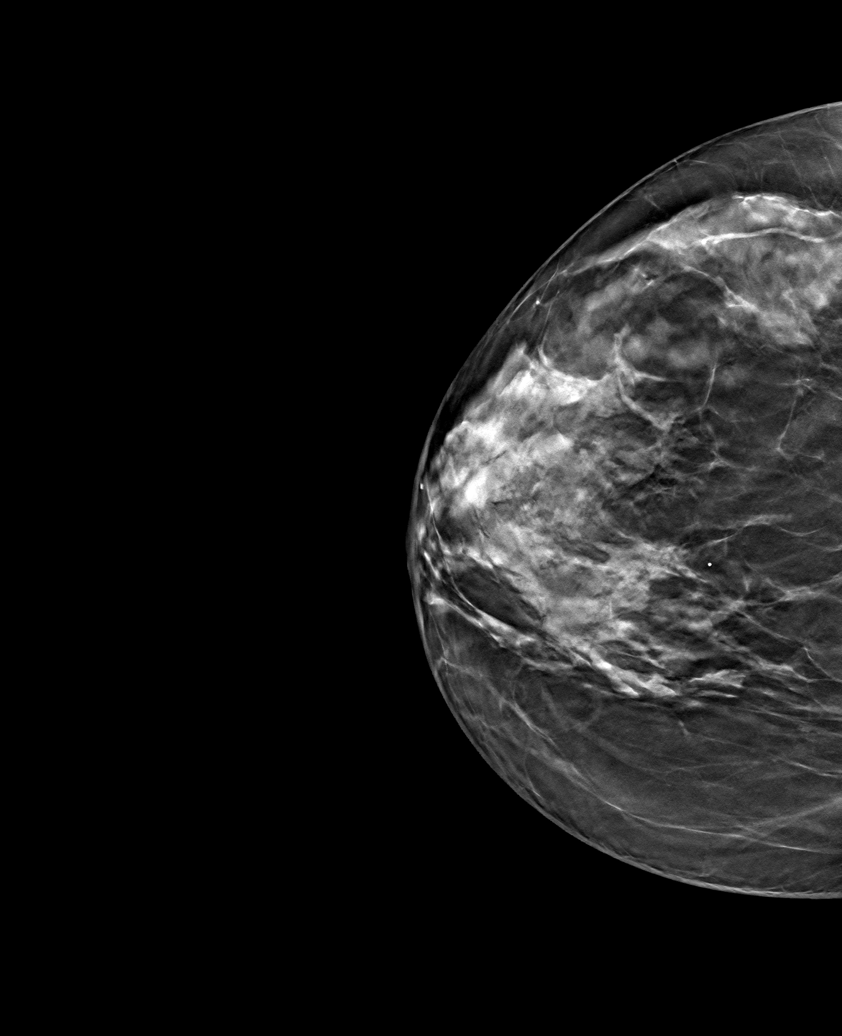

[6 of 30 positions shown; findings below may reference images not displayed]

ACR Breast Density Category c: The breast tissue is heterogeneously
dense, which may obscure small masses.
FINDINGS: There are no findings suspicious for malignancy.
IMPRESSION: No mammographic evidence of malignancy. A result letter of this
screening mammogram will be mailed directly to the patient.

RECOMMENDATION:
Screening mammogram in one year. (Code:Q3-W-BC3)

BI-RADS CATEGORY  1: Negative.

## 2023-02-14 ENCOUNTER — Ambulatory Visit: Payer: BC Managed Care – PPO | Admitting: Family Medicine

## 2023-02-20 ENCOUNTER — Ambulatory Visit: Payer: 59 | Admitting: Orthopaedic Surgery

## 2023-02-21 ENCOUNTER — Ambulatory Visit: Payer: 59 | Admitting: Orthopaedic Surgery

## 2023-03-06 ENCOUNTER — Ambulatory Visit: Payer: BC Managed Care – PPO | Admitting: Family Medicine

## 2023-04-10 ENCOUNTER — Ambulatory Visit: Payer: BC Managed Care – PPO | Admitting: Family Medicine

## 2023-04-12 ENCOUNTER — Ambulatory Visit: Payer: 59 | Admitting: Orthopaedic Surgery

## 2023-04-24 ENCOUNTER — Ambulatory Visit: Admitting: Family Medicine

## 2023-05-02 ENCOUNTER — Ambulatory Visit: Admitting: Orthopaedic Surgery

## 2023-05-12 ENCOUNTER — Other Ambulatory Visit: Payer: Self-pay | Admitting: Family Medicine

## 2023-05-12 DIAGNOSIS — E1165 Type 2 diabetes mellitus with hyperglycemia: Secondary | ICD-10-CM

## 2023-05-16 ENCOUNTER — Ambulatory Visit: Admitting: Orthopaedic Surgery

## 2023-05-18 ENCOUNTER — Ambulatory Visit: Admitting: Family Medicine

## 2023-06-20 ENCOUNTER — Ambulatory Visit: Admitting: Family Medicine

## 2023-07-25 ENCOUNTER — Ambulatory Visit: Admitting: Family Medicine

## 2023-08-14 DIAGNOSIS — Z043 Encounter for examination and observation following other accident: Secondary | ICD-10-CM | POA: Diagnosis not present

## 2023-08-14 DIAGNOSIS — Z041 Encounter for examination and observation following transport accident: Secondary | ICD-10-CM | POA: Diagnosis not present

## 2023-08-14 DIAGNOSIS — S0990XA Unspecified injury of head, initial encounter: Secondary | ICD-10-CM | POA: Diagnosis not present

## 2023-08-16 ENCOUNTER — Ambulatory Visit: Admitting: Nurse Practitioner

## 2023-08-16 ENCOUNTER — Encounter: Payer: Self-pay | Admitting: Nurse Practitioner

## 2023-08-16 DIAGNOSIS — M25511 Pain in right shoulder: Secondary | ICD-10-CM | POA: Diagnosis not present

## 2023-08-16 DIAGNOSIS — M545 Low back pain, unspecified: Secondary | ICD-10-CM

## 2023-08-16 DIAGNOSIS — M25512 Pain in left shoulder: Secondary | ICD-10-CM | POA: Diagnosis not present

## 2023-08-16 MED ORDER — METHOCARBAMOL 500 MG PO TABS: 500.0000 mg | ORAL_TABLET | Freq: Three times a day (TID) | ORAL | 0 refills | Status: AC | PRN
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
## 2023-08-17 ENCOUNTER — Telehealth: Payer: Self-pay | Admitting: Family Medicine

## 2023-08-17 DIAGNOSIS — M545 Low back pain, unspecified: Secondary | ICD-10-CM

## 2023-08-17 DIAGNOSIS — M25511 Pain in right shoulder: Secondary | ICD-10-CM

## 2023-08-17 NOTE — Telephone Encounter (Signed)
 Copied from CRM [id number]. Topic: Referral - Question >> Aug 17, 2023  1:52 PM Sophia H wrote: Reason for CRM: Patient is calling in to see if a referral can be placed to physical therapy. States she can feel her body stiffening up and doesn't want to be in any more pain. Was seen in clinic yesterday 07/24 and patient rescheduled her med check with PCP for 07/29 per her requests she still wanted to follow up with pcp. If she needs to wait till appt with PCP she can but wants to get this done ASAP and that is why she is asking if referral can be placed now. Ty

## 2023-08-17 NOTE — Telephone Encounter (Signed)
 Patient aware and verbalized understanding.

## 2023-08-21 ENCOUNTER — Encounter: Payer: Self-pay | Admitting: Family Medicine

## 2023-08-21 ENCOUNTER — Ambulatory Visit (INDEPENDENT_AMBULATORY_CARE_PROVIDER_SITE_OTHER): Admitting: Family Medicine

## 2023-08-21 ENCOUNTER — Ambulatory Visit: Admitting: Family Medicine

## 2023-08-21 VITALS — BP 104/71 | HR 82 | Temp 95.3°F | Ht 68.0 in | Wt 192.0 lb

## 2023-08-21 DIAGNOSIS — Z1159 Encounter for screening for other viral diseases: Secondary | ICD-10-CM | POA: Diagnosis not present

## 2023-08-21 DIAGNOSIS — E1169 Type 2 diabetes mellitus with other specified complication: Secondary | ICD-10-CM

## 2023-08-21 DIAGNOSIS — M791 Myalgia, unspecified site: Secondary | ICD-10-CM

## 2023-08-21 DIAGNOSIS — E785 Hyperlipidemia, unspecified: Secondary | ICD-10-CM | POA: Diagnosis not present

## 2023-08-21 DIAGNOSIS — I700 Atherosclerosis of aorta: Secondary | ICD-10-CM | POA: Diagnosis not present

## 2023-08-21 DIAGNOSIS — Z114 Encounter for screening for human immunodeficiency virus [HIV]: Secondary | ICD-10-CM | POA: Diagnosis not present

## 2023-08-21 DIAGNOSIS — E1165 Type 2 diabetes mellitus with hyperglycemia: Secondary | ICD-10-CM

## 2023-08-21 MED ORDER — ENALAPRIL MALEATE 2.5 MG PO TABS
2.5000 mg | ORAL_TABLET | Freq: Every day | ORAL | 1 refills | Status: AC
Start: 2023-08-21 — End: ?

## 2023-08-21 MED ORDER — TRULICITY 4.5 MG/0.5ML ~~LOC~~ SOAJ: 4.5000 mg | SUBCUTANEOUS | 3 refills | Status: DC
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
## 2023-08-22 ENCOUNTER — Encounter

## 2023-08-28 ENCOUNTER — Ambulatory Visit: Attending: Family Medicine

## 2023-08-28 DIAGNOSIS — M542 Cervicalgia: Secondary | ICD-10-CM | POA: Diagnosis present

## 2023-08-28 DIAGNOSIS — M25512 Pain in left shoulder: Secondary | ICD-10-CM | POA: Diagnosis present

## 2023-08-28 DIAGNOSIS — M5459 Other low back pain: Secondary | ICD-10-CM | POA: Insufficient documentation

## 2023-08-28 DIAGNOSIS — M25511 Pain in right shoulder: Secondary | ICD-10-CM | POA: Insufficient documentation

## 2023-08-28 DIAGNOSIS — M545 Low back pain, unspecified: Secondary | ICD-10-CM | POA: Diagnosis not present
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
## 2023-08-31 ENCOUNTER — Ambulatory Visit: Payer: Self-pay

## 2023-08-31 ENCOUNTER — Ambulatory Visit

## 2023-08-31 DIAGNOSIS — M5459 Other low back pain: Secondary | ICD-10-CM

## 2023-08-31 DIAGNOSIS — M25511 Pain in right shoulder: Secondary | ICD-10-CM

## 2023-08-31 DIAGNOSIS — M542 Cervicalgia: Secondary | ICD-10-CM | POA: Diagnosis not present
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
## 2023-08-31 NOTE — Telephone Encounter (Signed)
 I called and spoke with patient and made her aware of providers notes and advise. Patient upset but voiced understanding.

## 2023-09-04 ENCOUNTER — Encounter

## 2023-09-05 ENCOUNTER — Telehealth: Payer: Self-pay | Admitting: Family Medicine

## 2023-09-05 DIAGNOSIS — M25552 Pain in left hip: Secondary | ICD-10-CM

## 2023-09-05 DIAGNOSIS — M545 Low back pain, unspecified: Secondary | ICD-10-CM

## 2023-09-05 DIAGNOSIS — M25511 Pain in right shoulder: Secondary | ICD-10-CM

## 2023-09-05 NOTE — Telephone Encounter (Signed)
 Patient aware
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
## 2023-09-07 ENCOUNTER — Ambulatory Visit

## 2023-09-07 DIAGNOSIS — M25511 Pain in right shoulder: Secondary | ICD-10-CM

## 2023-09-07 DIAGNOSIS — M542 Cervicalgia: Secondary | ICD-10-CM

## 2023-09-07 DIAGNOSIS — M5459 Other low back pain: Secondary | ICD-10-CM
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
## 2023-09-11 ENCOUNTER — Encounter

## 2023-09-12 ENCOUNTER — Ambulatory Visit

## 2023-09-12 DIAGNOSIS — M25511 Pain in right shoulder: Secondary | ICD-10-CM

## 2023-09-12 DIAGNOSIS — M5459 Other low back pain: Secondary | ICD-10-CM

## 2023-09-12 DIAGNOSIS — M542 Cervicalgia: Secondary | ICD-10-CM
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
## 2023-09-13 ENCOUNTER — Ambulatory Visit (INDEPENDENT_AMBULATORY_CARE_PROVIDER_SITE_OTHER): Admitting: Orthopaedic Surgery

## 2023-09-13 ENCOUNTER — Encounter: Payer: Self-pay | Admitting: Orthopaedic Surgery

## 2023-09-13 ENCOUNTER — Other Ambulatory Visit (INDEPENDENT_AMBULATORY_CARE_PROVIDER_SITE_OTHER)

## 2023-09-13 DIAGNOSIS — M25512 Pain in left shoulder: Secondary | ICD-10-CM

## 2023-09-13 MED ORDER — HYDROCODONE-ACETAMINOPHEN 5-325 MG PO TABS: ORAL_TABLET | ORAL | 0 refills | Status: AC
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
## 2023-09-14 ENCOUNTER — Encounter: Payer: Self-pay | Admitting: *Deleted

## 2023-09-14 ENCOUNTER — Encounter: Payer: Self-pay | Admitting: Radiology

## 2023-09-14 ENCOUNTER — Ambulatory Visit: Admitting: *Deleted

## 2023-09-14 DIAGNOSIS — M542 Cervicalgia: Secondary | ICD-10-CM | POA: Diagnosis not present

## 2023-09-14 DIAGNOSIS — M5459 Other low back pain: Secondary | ICD-10-CM

## 2023-09-14 DIAGNOSIS — M25511 Pain in right shoulder: Secondary | ICD-10-CM
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
## 2023-09-18 ENCOUNTER — Ambulatory Visit

## 2023-09-18 DIAGNOSIS — M542 Cervicalgia: Secondary | ICD-10-CM

## 2023-09-18 DIAGNOSIS — M25511 Pain in right shoulder: Secondary | ICD-10-CM
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
## 2023-09-21 ENCOUNTER — Ambulatory Visit

## 2023-09-21 DIAGNOSIS — M542 Cervicalgia: Secondary | ICD-10-CM | POA: Diagnosis not present

## 2023-09-21 DIAGNOSIS — M25511 Pain in right shoulder: Secondary | ICD-10-CM
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
## 2023-09-25 ENCOUNTER — Encounter: Payer: Self-pay | Admitting: *Deleted

## 2023-09-25 ENCOUNTER — Ambulatory Visit: Payer: Self-pay | Attending: Family Medicine | Admitting: *Deleted

## 2023-09-25 DIAGNOSIS — M25511 Pain in right shoulder: Secondary | ICD-10-CM | POA: Diagnosis present

## 2023-09-25 DIAGNOSIS — M5459 Other low back pain: Secondary | ICD-10-CM | POA: Diagnosis present

## 2023-09-25 DIAGNOSIS — M25512 Pain in left shoulder: Secondary | ICD-10-CM | POA: Insufficient documentation

## 2023-09-25 DIAGNOSIS — M542 Cervicalgia: Secondary | ICD-10-CM | POA: Insufficient documentation
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
## 2023-09-27 ENCOUNTER — Encounter: Payer: Self-pay | Admitting: Orthopaedic Surgery

## 2023-09-27 ENCOUNTER — Ambulatory Visit: Admitting: Orthopaedic Surgery

## 2023-09-27 DIAGNOSIS — M25512 Pain in left shoulder: Secondary | ICD-10-CM

## 2023-09-27 NOTE — Addendum Note (Signed)
 Addended by: TRINDA DEANE HERO on: 09/27/2023 04:17 PM   Modules accepted: Orders

## 2023-09-27 NOTE — Progress Notes (Signed)
 My shoulder is still hurting.  She has more pain in the left shoulder.  She has limited motion and positive drop sign.  NV intact.  I am concerned about rotator cuff injury.  I will get MRI at open unit.  PROCEDURE NOTE:  The patient request injection, verbal consent was obtained.  The left shoulder was prepped appropriately after time out was performed.   Sterile technique was observed and injection of 1 cc of DepoMedrol 40mg  with several cc's of plain xylocaine . Anesthesia was provided by ethyl chloride and a 20-gauge needle was used to inject the shoulder area. A posterior approach was used.  The injection was tolerated well.  A band aid dressing was applied.  The patient was advised to apply ice later today and tomorrow to the injection sight as needed.  Encounter Diagnosis  Name Primary?   Acute pain of left shoulder Yes   Return in three weeks.  Get MRI at open unit.  Call if any problem.  Precautions discussed.  Electronically Signed [name], MD 9/4/202510:05 AM

## 2023-09-28 ENCOUNTER — Encounter: Payer: Self-pay | Admitting: *Deleted

## 2023-09-28 ENCOUNTER — Ambulatory Visit: Payer: Self-pay | Admitting: *Deleted

## 2023-09-28 DIAGNOSIS — M542 Cervicalgia: Secondary | ICD-10-CM

## 2023-09-28 DIAGNOSIS — M25511 Pain in right shoulder: Secondary | ICD-10-CM
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
## 2023-10-02 ENCOUNTER — Ambulatory Visit: Admitting: *Deleted

## 2023-10-03 ENCOUNTER — Ambulatory Visit

## 2023-10-03 DIAGNOSIS — M25511 Pain in right shoulder: Secondary | ICD-10-CM

## 2023-10-03 DIAGNOSIS — M542 Cervicalgia: Secondary | ICD-10-CM | POA: Diagnosis not present
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
## 2023-10-05 ENCOUNTER — Encounter: Payer: Self-pay | Admitting: *Deleted

## 2023-10-05 ENCOUNTER — Ambulatory Visit: Admitting: *Deleted

## 2023-10-05 DIAGNOSIS — M542 Cervicalgia: Secondary | ICD-10-CM | POA: Diagnosis not present

## 2023-10-05 DIAGNOSIS — M25512 Pain in left shoulder: Secondary | ICD-10-CM
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
## 2023-10-08 NOTE — Addendum Note (Signed)
 Addended by: Joniyah Mallinger, [place] W on: 10/08/2023 12:56 PM   Modules accepted: Orders

## 2023-10-09 ENCOUNTER — Ambulatory Visit

## 2023-10-09 DIAGNOSIS — M542 Cervicalgia: Secondary | ICD-10-CM

## 2023-10-09 DIAGNOSIS — M25511 Pain in right shoulder: Secondary | ICD-10-CM
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
## 2023-10-11 DIAGNOSIS — M25412 Effusion, left shoulder: Secondary | ICD-10-CM | POA: Diagnosis not present

## 2023-10-11 DIAGNOSIS — M7552 Bursitis of left shoulder: Secondary | ICD-10-CM | POA: Diagnosis not present

## 2023-10-11 DIAGNOSIS — M67912 Unspecified disorder of synovium and tendon, left shoulder: Secondary | ICD-10-CM | POA: Diagnosis not present

## 2023-10-11 DIAGNOSIS — M75112 Incomplete rotator cuff tear or rupture of left shoulder, not specified as traumatic: Secondary | ICD-10-CM | POA: Diagnosis not present

## 2023-10-11 DIAGNOSIS — M19012 Primary osteoarthritis, left shoulder: Secondary | ICD-10-CM | POA: Diagnosis not present

## 2023-10-11 DIAGNOSIS — M948X1 Other specified disorders of cartilage, shoulder: Secondary | ICD-10-CM | POA: Diagnosis not present

## 2023-10-12 ENCOUNTER — Encounter

## 2023-10-16 ENCOUNTER — Encounter

## 2023-10-18 ENCOUNTER — Encounter: Payer: Self-pay | Admitting: Orthopaedic Surgery

## 2023-10-18 ENCOUNTER — Telehealth: Payer: Self-pay | Admitting: Radiology

## 2023-10-18 ENCOUNTER — Ambulatory Visit: Admitting: Orthopaedic Surgery

## 2023-10-18 DIAGNOSIS — M25511 Pain in right shoulder: Secondary | ICD-10-CM | POA: Diagnosis not present

## 2023-10-18 DIAGNOSIS — G8929 Other chronic pain: Secondary | ICD-10-CM

## 2023-10-18 NOTE — Progress Notes (Signed)
 My shoulder is sore  Her MRI of the left shoulder shows: IMPRESSION:  1. Tendinosis of the rotator cuff.  2.  Intrasubstance partial tear/degenerative cyst of the infraspinatus with extension as a undersurface partial tear at its anterior distal insertion.  3.  Small regions of undersurface/intersubstance partial tear of the distal supraspinatus with undersurface partial tears of the upper distal subscapularis.  4.  Degenerative or old posttraumatic labral changes with diminutive appearance of the labrum and no displaced tears evident as well as moderate chondromalacia of the glenoid.  5.  Small glenohumeral joint effusion possibly degenerative/reactive in nature with mild subacromial subdeltoid bursitis.  6.  Mild degenerative hypertrophy at the [facility] joint with tiny os acromiale and undersurface changes of the acromion suggesting impingement.   I have explained the findings to her.  I will have her see Dr. Addie for further follow up here.  I am retiring next week.  Her left shoulder is tender, she has decreased motion and positive drop sign.  NV intact.  ROM of neck is full.  Encounter Diagnosis  Name Primary?   Chronic right shoulder pain Yes   To see Dr. Addie for the shoulder.  She would like to see Dr. [name] in [place] in the future for knee problems.  Call if any problem.  Precautions discussed.  Electronically Signed [name], MD 9/25/202510:12 AM

## 2023-10-18 NOTE — Telephone Encounter (Signed)
 Patient is a patient of Dr. Brenna.  She is being referred to Dr. Addie in [place] for a shoulder issue that she has, however, she would like to establish care with Dr. [name] as her primary orthopedic once Dr. Brenna lucky.

## 2023-10-22 ENCOUNTER — Telehealth: Payer: Self-pay | Admitting: Orthopaedic Surgery

## 2023-10-22 NOTE — Telephone Encounter (Signed)
 Dr. Janae pt - pt lvm stating she has an appointment with Dr. Addie on 11/12/23.  She would like a call back advising her is she should continue PT or not.  [phone]

## 2023-10-23 ENCOUNTER — Encounter: Payer: Self-pay | Admitting: Orthopaedic Surgery

## 2023-10-23 NOTE — Telephone Encounter (Signed)
IC advise.  

## 2023-10-26 ENCOUNTER — Ambulatory Visit: Attending: Family Medicine

## 2023-10-26 DIAGNOSIS — M25511 Pain in right shoulder: Secondary | ICD-10-CM | POA: Insufficient documentation

## 2023-10-26 DIAGNOSIS — M25512 Pain in left shoulder: Secondary | ICD-10-CM | POA: Insufficient documentation

## 2023-10-26 DIAGNOSIS — M542 Cervicalgia: Secondary | ICD-10-CM | POA: Diagnosis present
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
## 2023-10-30 ENCOUNTER — Encounter: Payer: Self-pay | Admitting: *Deleted

## 2023-10-30 ENCOUNTER — Other Ambulatory Visit: Payer: Self-pay | Admitting: Family Medicine

## 2023-10-30 ENCOUNTER — Ambulatory Visit: Admitting: *Deleted

## 2023-10-30 DIAGNOSIS — M542 Cervicalgia: Secondary | ICD-10-CM | POA: Diagnosis not present

## 2023-10-30 DIAGNOSIS — E1165 Type 2 diabetes mellitus with hyperglycemia: Secondary | ICD-10-CM

## 2023-10-30 DIAGNOSIS — M25511 Pain in right shoulder: Secondary | ICD-10-CM
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
## 2023-11-12 ENCOUNTER — Ambulatory Visit: Admitting: Orthopedic Surgery

## 2023-11-12 ENCOUNTER — Other Ambulatory Visit: Payer: Self-pay

## 2023-11-12 DIAGNOSIS — M25512 Pain in left shoulder: Secondary | ICD-10-CM | POA: Diagnosis not present

## 2023-11-12 DIAGNOSIS — M7502 Adhesive capsulitis of left shoulder: Secondary | ICD-10-CM

## 2023-11-13 ENCOUNTER — Encounter: Payer: Self-pay | Admitting: Orthopedic Surgery

## 2023-11-13 ENCOUNTER — Telehealth: Payer: Self-pay | Admitting: Orthopedic Surgery

## 2023-11-13 DIAGNOSIS — M25512 Pain in left shoulder: Secondary | ICD-10-CM

## 2023-11-13 MED ORDER — LIDOCAINE HCL 1 % IJ SOLN
5.0000 mL | INTRAMUSCULAR | Status: AC | PRN
  Administered 2023-11-12: 5 mL

## 2023-11-13 MED ORDER — TRIAMCINOLONE ACETONIDE 40 MG/ML IJ SUSP
40.0000 mg | INTRAMUSCULAR | Status: AC | PRN
  Administered 2023-11-12: 40 mg via INTRA_ARTICULAR

## 2023-11-13 MED ORDER — BUPIVACAINE HCL 0.5 % IJ SOLN
9.0000 mL | INTRAMUSCULAR | Status: AC | PRN
  Administered 2023-11-12: 9 mL via INTRA_ARTICULAR

## 2023-11-13 MED ORDER — HYDROCODONE-ACETAMINOPHEN 5-325 MG PO TABS: 1.0000 | ORAL_TABLET | Freq: Two times a day (BID) | ORAL | 0 refills | Status: AC | PRN

## 2023-11-13 NOTE — Telephone Encounter (Signed)
 Pt states that if she needs to do more therapy, she would like to have a order sent to [facility] on [address] [phone].

## 2023-11-13 NOTE — Telephone Encounter (Signed)
 Per Dr Sedrick last note: Continue with physical therapy to work on shoulder range of motion exercises.   PT order placed in chart
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
## 2023-11-21 ENCOUNTER — Ambulatory Visit: Attending: Orthopedic Surgery | Admitting: Physical Therapy

## 2023-11-21 ENCOUNTER — Other Ambulatory Visit (HOSPITAL_COMMUNITY): Payer: Self-pay

## 2023-11-21 ENCOUNTER — Other Ambulatory Visit: Payer: Self-pay

## 2023-11-21 ENCOUNTER — Encounter: Payer: Self-pay | Admitting: Family Medicine

## 2023-11-21 ENCOUNTER — Ambulatory Visit: Admitting: Family Medicine

## 2023-11-21 ENCOUNTER — Ambulatory Visit: Payer: Self-pay | Admitting: Family Medicine

## 2023-11-21 VITALS — BP 147/82 | HR 89 | Temp 96.2°F | Ht 68.0 in | Wt 186.6 lb

## 2023-11-21 DIAGNOSIS — M25612 Stiffness of left shoulder, not elsewhere classified: Secondary | ICD-10-CM | POA: Insufficient documentation

## 2023-11-21 DIAGNOSIS — E785 Hyperlipidemia, unspecified: Secondary | ICD-10-CM | POA: Diagnosis not present

## 2023-11-21 DIAGNOSIS — E1169 Type 2 diabetes mellitus with other specified complication: Secondary | ICD-10-CM | POA: Diagnosis not present

## 2023-11-21 DIAGNOSIS — M6281 Muscle weakness (generalized): Secondary | ICD-10-CM | POA: Diagnosis present

## 2023-11-21 DIAGNOSIS — I700 Atherosclerosis of aorta: Secondary | ICD-10-CM | POA: Diagnosis not present

## 2023-11-21 DIAGNOSIS — R293 Abnormal posture: Secondary | ICD-10-CM | POA: Diagnosis present

## 2023-11-21 DIAGNOSIS — M542 Cervicalgia: Secondary | ICD-10-CM | POA: Insufficient documentation

## 2023-11-21 DIAGNOSIS — Z7984 Long term (current) use of oral hypoglycemic drugs: Secondary | ICD-10-CM

## 2023-11-21 DIAGNOSIS — E1165 Type 2 diabetes mellitus with hyperglycemia: Secondary | ICD-10-CM | POA: Diagnosis not present

## 2023-11-21 DIAGNOSIS — F4323 Adjustment disorder with mixed anxiety and depressed mood: Secondary | ICD-10-CM

## 2023-11-21 DIAGNOSIS — G8929 Other chronic pain: Secondary | ICD-10-CM | POA: Insufficient documentation

## 2023-11-21 DIAGNOSIS — M7502 Adhesive capsulitis of left shoulder: Secondary | ICD-10-CM

## 2023-11-21 DIAGNOSIS — M25512 Pain in left shoulder: Secondary | ICD-10-CM | POA: Insufficient documentation

## 2023-11-21 LAB — BAYER DCA HB A1C WAIVED: HB A1C (BAYER DCA - WAIVED): 8.2 % — ABNORMAL HIGH (ref 4.8–5.6)

## 2023-11-21 LAB — LIPID PANEL

## 2023-11-21 MED ORDER — TRULICITY 4.5 MG/0.5ML ~~LOC~~ SOAJ: 4.5000 mg | SUBCUTANEOUS | 6 refills | Status: AC

## 2023-11-21 MED ORDER — ATORVASTATIN CALCIUM 10 MG PO TABS: 10.0000 mg | ORAL_TABLET | Freq: Every day | ORAL | 1 refills | Status: AC
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
## 2023-11-21 NOTE — Progress Notes (Signed)
 Subjective:  Patient ID: Catherine Chapman, female    DOB: [birth year], 63 y.o.   MRN: [id number]  Patient Care Team: Severa Rock HERO, FNP as PCP - General (Family Medicine) Ladora Ross Lacy Phebe, MD as Referring Physician (Optometry)   Chief Complaint:  Diabetes (3 month follow up )   HPI: Catherine Chapman is a 63 y.o. female presenting on 11/21/2023 for Diabetes (3 month follow up )   Catherine Chapman is a 63 year old female with diabetes who presents for follow-up of her blood sugar control and shoulder pain.  Her blood sugars have been elevated recently, which she attributes to increased stress and frequent consumption of sweets due to her current life circumstances. She is currently on Trulicity  4.5 mg weekly, Jardiance  10 mg daily, low-dose enalapril , atorvastatin , and aspirin . She has not taken Trulicity  this week as she ran out of her prescription.  She is experiencing ongoing left shoulder pain following an accident, diagnosed as a frozen shoulder with a slight tear. She has received three injections in the shoulder but continues to experience pain.  Her father, aged 63, recently fell and broke his hip on October 14, 2023, and is currently facing serious health issues, with hospice care being considered. She is actively involved in his care, which has added to her stress levels.  No changes in hair, skin, or nails, vision changes, increased urination, chest pain, leg swelling, or shortness of breath. She reports sleeping as well as can be expected given her circumstances.          Relevant past medical, surgical, family, and social history reviewed and updated as indicated.  Allergies and medications reviewed and updated. Data reviewed: Chart in Epic.   Past Medical History:  Diagnosis Date   Aortic atherosclerosis    Back pain    Diabetes (HCC)    Mediastinal lymphadenopathy 03/06/2021   Chest CT at [facility]   Pulmonary nodule 03/06/2021   Chest CT at [facility]     Past Surgical History:  Procedure Laterality Date   I & D EXTREMITY Left 01/17/2017   Procedure: IRRIGATION AND DEBRIBEMENT WITH OPEN REDUCTION INTERNAL FIXATION LEFT INDEX FINGER WITH EXTENSOR TENDON REPAIR;  Surgeon: Sissy Cough, MD;  Location: [facility] OR;  Service: Orthopedics;  Laterality: Left;   KNEE SURGERY     KNEE SURGERY     ROTATOR CUFF REPAIR      Social History   Socioeconomic History   Marital status: Single    Spouse name: Not on file   Number of children: Not on file   Years of education: Not on file   Highest education level: Not on file  Occupational History   Not on file  Tobacco Use   Smoking status: Former    Current packs/day: 0.00    Types: Cigarettes    Quit date: 43    Years since quitting: 35.8   Smokeless tobacco: Never  Vaping Use   Vaping status: Never Used  Substance and Sexual Activity   Alcohol use: Yes    Comment: occ   Drug use: No   Sexual activity: Yes    Birth control/protection: None  Other Topics Concern   Not on file  Social History Narrative   Not on file   Social Drivers of Health   Financial Resource Strain: Not on File (12/25/2019)   Received from [employer]    Financial Resource Strain: 0  Food Insecurity:  Not on File (10/19/2022)   Received from [employer]    Food: 0  Transportation Needs: Not on File (12/25/2019)   Received from [employer] [facility]    Transportation: 0  Physical Activity: Not on File (12/25/2019)   Received from [facility]   Physical Activity    Physical Activity: 0  Stress: Not on File (12/25/2019)   Received from [facility]   Stress    Stress: 0  Social Connections: Not on File (10/02/2022)   Received from [facility]   Social Connections    Connectedness: 0  Intimate Partner Violence: Not At Risk (08/14/2023)   Received from [facility]   HITS    Over the last 12 months how often did your partner physically hurt you?: Never    Over the last 12 months  how often did your partner insult you or talk down to you?: Never    Over the last 12 months how often did your partner threaten you with physical harm?: Never    Over the last 12 months how often did your partner scream or curse at you?: Never    Outpatient Encounter Medications as of 11/21/2023  Medication Sig   aspirin  81 MG EC tablet Take 1 tablet (81 mg total) by mouth daily. Swallow whole.   Continuous Blood Gluc Sensor (FREESTYLE LIBRE 3 SENSOR) MISC 1 Device by Does not apply route every 14 (fourteen) days. Place 1 sensor on the skin every 14 days. Use to check glucose continuously   empagliflozin  (JARDIANCE ) 10 MG TABS tablet TAKE ONE TABLET DAILY BEFORE BREAKFAST   enalapril  (VASOTEC ) 2.5 MG tablet Take 1 tablet (2.5 mg total) by mouth daily.   glucose blood (ACCU-CHEK GUIDE) test strip Check BS up to 4 times daily Dx E11.65   HYDROcodone -acetaminophen  (NORCO/VICODIN) 5-325 MG tablet One tablet every four hours as needed for acute pain.  Limit of five days per [place].   HYDROcodone -acetaminophen  (NORCO/VICODIN) 5-325 MG tablet Take 1 tablet by mouth every 12 (twelve) hours as needed for moderate pain (pain score 4-6).   Insulin  Pen Needle 32G X 4 MM MISC Use with insulin  daily Dx E11.69   methocarbamol  (ROBAXIN ) 500 MG tablet Take 1 tablet (500 mg total) by mouth every 8 (eight) hours as needed for muscle spasms.   [DISCONTINUED] Dulaglutide  (TRULICITY ) 4.5 MG/0.5ML SOAJ Inject 4.5 mg into the skin once a week.   atorvastatin  (LIPITOR) 10 MG tablet Take 1 tablet (10 mg total) by mouth daily.   Dulaglutide  (TRULICITY ) 4.5 MG/0.5ML SOAJ Inject 4.5 mg into the skin once a week.   [DISCONTINUED] atorvastatin  (LIPITOR) 10 MG tablet Take 1 tablet (10 mg total) by mouth daily.   No facility-administered encounter medications on file as of 11/21/2023.    Allergies  Allergen Reactions   Tramadol Nausea And Vomiting and Dermatitis   Metformin  Other (See Comments)    Headaches    Metformin  And Related Other (See Comments)    Headaches    Pertinent ROS per HPI, otherwise unremarkable      Objective:  BP (!) 147/82   Pulse 89   Temp (!) 96.2 F (35.7 C)   Ht 5' 8 (1.727 m)   Wt 186 lb 9.6 oz (84.6 kg)   SpO2 99%   BMI 28.37 kg/m    Wt Readings from Last 3 Encounters:  11/21/23 186 lb 9.6 oz (84.6 kg)  08/21/23 192 lb (87.1 kg)  08/16/23 191 lb 3.2 oz (86.7 kg)  Physical Exam Vitals and nursing note reviewed.  Constitutional:      General: She is not in acute distress.    Appearance: Normal appearance. She is well-developed, well-groomed and overweight. She is not ill-appearing, toxic-appearing or diaphoretic.  HENT:     Head: Normocephalic and atraumatic.     Nose: Nose normal.     Mouth/Throat:     Mouth: Mucous membranes are moist.     Pharynx: Oropharynx is clear.  Eyes:     Pupils: Pupils are equal, round, and reactive to light.  Cardiovascular:     Rate and Rhythm: Normal rate and regular rhythm.     Heart sounds: Normal heart sounds.  Pulmonary:     Effort: Pulmonary effort is normal.     Breath sounds: Normal breath sounds.  Musculoskeletal:     Left shoulder: Decreased range of motion.     Cervical back: Neck supple.     Right lower leg: No edema.     Left lower leg: No edema.  Skin:    General: Skin is warm and dry.     Capillary Refill: Capillary refill takes less than 2 seconds.  Neurological:     General: No focal deficit present.     Mental Status: She is alert and oriented to person, place, and time.  Psychiatric:        Mood and Affect: Mood normal.        Behavior: Behavior normal. Behavior is cooperative.        Thought Content: Thought content normal.        Judgment: Judgment normal.      Results for orders placed or performed in visit on 10/10/22  Bayer DCA Hb A1c Waived   Collection Time: 10/10/22  3:34 PM  Result Value Ref Range   HB A1C (BAYER DCA - WAIVED) 8.4 (H) 4.8 - 5.6 %       Pertinent labs  & imaging results that were available during my care of the patient were reviewed by me and considered in my medical decision making.  Assessment & Plan:  Catherine Chapman was seen today for diabetes.  Diagnoses and all orders for this visit:  Type 2 diabetes mellitus with hyperglycemia, without long-term current use of insulin  (HCC) -     Bayer DCA Hb A1c Waived -     atorvastatin  (LIPITOR) 10 MG tablet; Take 1 tablet (10 mg total) by mouth daily. -     Microalbumin / creatinine urine ratio -     Dulaglutide  (TRULICITY ) 4.5 MG/0.5ML SOAJ; Inject 4.5 mg into the skin once a week.  Hyperlipidemia associated with type 2 diabetes mellitus (HCC) -     CMP14+EGFR -     Lipid panel -     CBC with Differential/Platelet -     atorvastatin  (LIPITOR) 10 MG tablet; Take 1 tablet (10 mg total) by mouth daily.  Aortic atherosclerosis -     atorvastatin  (LIPITOR) 10 MG tablet; Take 1 tablet (10 mg total) by mouth daily.  Adhesive capsulitis of left shoulder Followed by ortho  Situational mixed anxiety and depressive disorder Discussed symptom management. Declines treatment at this time.      Type 2 diabetes mellitus with hyperglycemia A1c is 8.2, slightly improved from 8.4. Blood sugars have been elevated, likely due to stress and binge eating. Currently on Trulicity  4.5 mg weekly, Jardiance  10 mg, low dose enalapril  for kidney protection, atorvastatin , and aspirin . No issues with atorvastatin . No changes in hair, skin, nails, vision,  urination, chest pain, leg swelling, or shortness of breath. - Sent prescription for Trulicity  4.5 mg weekly. - Continue Jardiance  10 mg. - Continue low dose enalapril  for kidney protection. - Continue atorvastatin  and aspirin . - Ordered urine test to check protein levels. - Advised to adjust binge eating habits. - Will reassess A1c in 3 months.  Adhesive capsulitis of left shoulder Left shoulder adhesive capsulitis with three injections administered. Possible surgery  after December 1st if no improvement. Condition is unrelated to diabetes. - Continue to monitor shoulder condition and consider surgery if no improvement.        Continue all other maintenance medications.  Follow up plan: Return in about 3 months (around 02/21/2024) for HTN, DM.   Continue healthy lifestyle choices, including diet (rich in fruits, vegetables, and lean proteins, and low in salt and simple carbohydrates) and exercise (at least 30 minutes of moderate physical activity daily).  Educational handout given for DM  The above assessment and management plan was discussed with the patient. The patient verbalized understanding of and has agreed to the management plan. Patient is aware to call the clinic if they develop any new symptoms or if symptoms persist or worsen. Patient is aware when to return to the clinic for a follow-up visit. Patient educated on when it is appropriate to go to the emergency department.   Rosaline Bruns, FNP-C [facility] [phone]

## 2023-11-21 NOTE — Patient Instructions (Addendum)

## 2023-11-22 LAB — CMP14+EGFR
ALT: 35 IU/L — ABNORMAL HIGH (ref 0–32)
AST: 24 IU/L (ref 0–40)
Albumin: 4.5 g/dL (ref 3.9–4.9)
Alkaline Phosphatase: 119 IU/L (ref 49–135)
BUN/Creatinine Ratio: 23 (ref 12–28)
BUN: 20 mg/dL (ref 8–27)
Bilirubin Total: 0.5 mg/dL (ref 0.0–1.2)
CO2: 22 mmol/L (ref 20–29)
Calcium: 9.6 mg/dL (ref 8.7–10.3)
Chloride: 98 mmol/L (ref 96–106)
Creatinine, Ser: 0.87 mg/dL (ref 0.57–1.00)
Globulin, Total: 3.2 g/dL (ref 1.5–4.5)
Glucose: 222 mg/dL — ABNORMAL HIGH (ref 70–99)
Potassium: 4.6 mmol/L (ref 3.5–5.2)
Sodium: 138 mmol/L (ref 134–144)
Total Protein: 7.7 g/dL (ref 6.0–8.5)
eGFR: 75 mL/min/1.73 (ref >59)

## 2023-11-22 LAB — LIPID PANEL
Cholesterol, Total: 217 mg/dL — AB (ref 100–199)
HDL: 59 mg/dL (ref >39)
LDL CALC COMMENT:: 3.7 ratio (ref 0.0–4.4)
LDL Chol Calc (NIH): 139 mg/dL — AB (ref 0–99)
Triglycerides: 107 mg/dL (ref 0–149)
VLDL Cholesterol Cal: 19 mg/dL (ref 5–40)

## 2023-11-22 LAB — CBC WITH DIFFERENTIAL/PLATELET
Basophils Absolute: 0 x10E3/uL (ref 0.0–0.2)
Basos: 0 %
EOS (ABSOLUTE): 0.1 x10E3/uL (ref 0.0–0.4)
Eos: 1 %
Hematocrit: 47.8 % — ABNORMAL HIGH (ref 34.0–46.6)
Hemoglobin: 15.4 g/dL (ref 11.1–15.9)
Immature Grans (Abs): 0.1 x10E3/uL (ref 0.0–0.1)
Immature Granulocytes: 1 %
Lymphocytes Absolute: 1.8 x10E3/uL (ref 0.7–3.1)
Lymphs: 15 %
MCH: 29.1 pg (ref 26.6–33.0)
MCHC: 32.2 g/dL (ref 31.5–35.7)
MCV: 90 fL (ref 79–97)
Monocytes Absolute: 0.7 x10E3/uL (ref 0.1–0.9)
Monocytes: 6 %
Neutrophils Absolute: 9.1 x10E3/uL — ABNORMAL HIGH (ref 1.4–7.0)
Neutrophils: 77 %
Platelets: 328 x10E3/uL (ref 150–450)
RBC: 5.29 x10E6/uL — ABNORMAL HIGH (ref 3.77–5.28)
RDW: 12.6 % (ref 11.7–15.4)
WBC: 11.8 x10E3/uL — ABNORMAL HIGH (ref 3.4–10.8)

## 2023-11-22 LAB — MICROALBUMIN / CREATININE URINE RATIO
Creatinine, Urine: 25 mg/dL
Microalb/Creat Ratio: <12 mg/g{creat} (ref 0–29)
Microalbumin, Urine: <3 ug/mL

## 2023-11-26 ENCOUNTER — Encounter: Payer: Self-pay | Admitting: Radiology

## 2023-11-29 ENCOUNTER — Ambulatory Visit: Attending: Orthopedic Surgery

## 2023-11-29 DIAGNOSIS — G8929 Other chronic pain: Secondary | ICD-10-CM | POA: Diagnosis present

## 2023-11-29 DIAGNOSIS — M25612 Stiffness of left shoulder, not elsewhere classified: Secondary | ICD-10-CM | POA: Diagnosis present

## 2023-11-29 DIAGNOSIS — M6281 Muscle weakness (generalized): Secondary | ICD-10-CM | POA: Diagnosis present

## 2023-11-29 DIAGNOSIS — M25512 Pain in left shoulder: Secondary | ICD-10-CM | POA: Diagnosis present
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
## 2023-12-04 ENCOUNTER — Ambulatory Visit

## 2023-12-04 DIAGNOSIS — M6281 Muscle weakness (generalized): Secondary | ICD-10-CM

## 2023-12-04 DIAGNOSIS — M25612 Stiffness of left shoulder, not elsewhere classified: Secondary | ICD-10-CM | POA: Diagnosis not present

## 2023-12-04 DIAGNOSIS — G8929 Other chronic pain: Secondary | ICD-10-CM
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
## 2023-12-11 ENCOUNTER — Ambulatory Visit

## 2023-12-11 DIAGNOSIS — M25612 Stiffness of left shoulder, not elsewhere classified: Secondary | ICD-10-CM

## 2023-12-11 DIAGNOSIS — M6281 Muscle weakness (generalized): Secondary | ICD-10-CM

## 2023-12-11 DIAGNOSIS — G8929 Other chronic pain: Secondary | ICD-10-CM
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
## 2023-12-18 ENCOUNTER — Ambulatory Visit: Admitting: *Deleted

## 2023-12-18 DIAGNOSIS — M25612 Stiffness of left shoulder, not elsewhere classified: Secondary | ICD-10-CM

## 2023-12-18 DIAGNOSIS — M6281 Muscle weakness (generalized): Secondary | ICD-10-CM

## 2023-12-18 DIAGNOSIS — G8929 Other chronic pain: Secondary | ICD-10-CM
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
## 2023-12-24 ENCOUNTER — Ambulatory Visit: Admitting: Orthopedic Surgery

## 2023-12-24 ENCOUNTER — Ambulatory Visit: Attending: Orthopedic Surgery

## 2023-12-24 DIAGNOSIS — M7502 Adhesive capsulitis of left shoulder: Secondary | ICD-10-CM

## 2023-12-24 DIAGNOSIS — M542 Cervicalgia: Secondary | ICD-10-CM | POA: Insufficient documentation

## 2023-12-24 DIAGNOSIS — M25511 Pain in right shoulder: Secondary | ICD-10-CM | POA: Diagnosis present

## 2023-12-24 DIAGNOSIS — M25612 Stiffness of left shoulder, not elsewhere classified: Secondary | ICD-10-CM | POA: Insufficient documentation

## 2023-12-24 DIAGNOSIS — R293 Abnormal posture: Secondary | ICD-10-CM | POA: Diagnosis present

## 2023-12-24 DIAGNOSIS — M25512 Pain in left shoulder: Secondary | ICD-10-CM | POA: Insufficient documentation

## 2023-12-24 DIAGNOSIS — M6281 Muscle weakness (generalized): Secondary | ICD-10-CM | POA: Insufficient documentation

## 2023-12-24 DIAGNOSIS — G8929 Other chronic pain: Secondary | ICD-10-CM | POA: Diagnosis present
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
## 2023-12-25 ENCOUNTER — Encounter: Payer: Self-pay | Admitting: Orthopedic Surgery
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
## 2024-01-03 ENCOUNTER — Telehealth: Payer: Self-pay

## 2024-01-03 NOTE — Telephone Encounter (Signed)
 Patient scheduled to be seen 01/04/2024 by PCP.

## 2024-01-03 NOTE — Telephone Encounter (Signed)
 Copied from CRM #[id number]. Topic: Clinical - Medication Question >> Jan 03, 2024 11:06 AM Montie POUR wrote: Reason for CRM:  Catherine Chapman is calling to see if FNP Rakes will prescript medication to help her with emotions since she lost her dad this past Tuesday (01/01/24). Please call her at [phone] to discuss.

## 2024-01-04 ENCOUNTER — Ambulatory Visit: Admitting: Family Medicine

## 2024-01-04 ENCOUNTER — Encounter: Payer: Self-pay | Admitting: Family Medicine

## 2024-01-04 VITALS — BP 131/84 | HR 82 | Temp 96.4°F | Ht 68.0 in | Wt 180.2 lb

## 2024-01-04 DIAGNOSIS — F4323 Adjustment disorder with mixed anxiety and depressed mood: Secondary | ICD-10-CM

## 2024-01-04 DIAGNOSIS — F4320 Adjustment disorder, unspecified: Secondary | ICD-10-CM

## 2024-01-04 MED ORDER — HYDROXYZINE HCL 10 MG PO TABS: 10.0000 mg | ORAL_TABLET | Freq: Three times a day (TID) | ORAL | 3 refills | Status: AC | PRN

## 2024-01-04 NOTE — Progress Notes (Signed)
 Subjective:  Patient ID: Catherine Chapman, female    DOB: August 31, 1960, 63 y.o.   MRN: [id number]  Patient Care Team: Severa Rock HERO, FNP as PCP - General (Family Medicine) Ladora Ross Lacy Phebe, MD as Referring Physician (Optometry)   Chief Complaint:  Depression (Dad passed away 01-11-24 )   HPI: Catherine Chapman is a 63 y.o. female presenting on 01/04/2024 for Depression (Dad passed away 2024/01/11 )  Catherine Chapman is a 63 year old female who presents with insomnia and situational anxiety following the recent loss of her father.  She experiences significant difficulty sleeping, often not getting more than four hours of rest per night. Her anxiety manifests as nervousness and a preference to avoid large groups of people. She also has frequent crying episodes.  Her appetite is poor, and she eats minimally, primarily to take her medication. Despite this, she adheres to her prescribed medications.  She has a history of using Klonopin for anxiety and depression over twenty years ago due to job-related stress. Initially, she took it daily and then transitioned to as-needed use until she was weaned off. She found it beneficial at the time.         01/04/2024    9:52 AM 11/21/2023    9:49 AM 10/10/2022    3:35 PM 10/10/2022    3:26 PM 07/25/2022    8:53 AM  Depression screen PHQ 2/9  Decreased Interest 1 0 0 0 1  Down, Depressed, Hopeless 1 0  0 0  PHQ - 2 Score 2 0 0 0 1  Altered sleeping 1 0  0 1  Tired, decreased energy 1 0  0 1  Change in appetite 1 0  0 1  Feeling bad or failure about yourself  1 0  0 0  Trouble concentrating 1 0  0 0  Moving slowly or fidgety/restless 0 0  0 1  Suicidal thoughts 0 0  0 0  PHQ-9 Score 7 0   0  5   Difficult doing work/chores Somewhat difficult Not difficult at all  Not difficult at all Not difficult at all     Data saved with a previous flowsheet row definition      01/04/2024    9:52 AM 11/21/2023    9:49 AM 10/10/2022    3:26 PM 07/25/2022    8:53 AM   GAD 7 : Generalized Anxiety Score  Nervous, Anxious, on Edge 1 0 0 0  Control/stop worrying 1 0 0 0  Worry too much - different things 1 0 0 1  Trouble relaxing 1 0 0 1  Restless 1 0 0 1  Easily annoyed or irritable 1 0 0 0  Afraid - awful might happen 0 0 0 0  Total GAD 7 Score 6 0 0 3  Anxiety Difficulty Somewhat difficult Not difficult at all Not difficult at all Not difficult at all        Relevant past medical, surgical, family, and social history reviewed and updated as indicated.  Allergies and medications reviewed and updated. Data reviewed: Chart in Epic.   Past Medical History:  Diagnosis Date   Aortic atherosclerosis    Back pain    Diabetes (HCC)    Mediastinal lymphadenopathy 03/06/2021   Chest CT at [facility]   Pulmonary nodule 03/06/2021   Chest CT at [facility]    Past Surgical History:  Procedure Laterality Date   I & D EXTREMITY Left 01/17/2017  Procedure: IRRIGATION AND DEBRIBEMENT WITH OPEN REDUCTION INTERNAL FIXATION LEFT INDEX FINGER WITH EXTENSOR TENDON REPAIR;  Surgeon: Sissy Cough, MD;  Location: [facility] OR;  Service: Orthopedics;  Laterality: Left;   KNEE SURGERY     KNEE SURGERY     ROTATOR CUFF REPAIR      Social History   Socioeconomic History   Marital status: Single    Spouse name: Not on file   Number of children: Not on file   Years of education: Not on file   Highest education level: Not on file  Occupational History   Not on file  Tobacco Use   Smoking status: Former    Current packs/day: 0.00    Types: Cigarettes    Quit date: 57    Years since quitting: 35.9   Smokeless tobacco: Never  Vaping Use   Vaping status: Never Used  Substance and Sexual Activity   Alcohol use: Yes    Comment: occ   Drug use: No   Sexual activity: Yes    Birth control/protection: None  Other Topics Concern   Not on file  Social History Narrative   Not on file   Social Drivers of Health   Tobacco Use: Medium Risk  (01/04/2024)   Patient History    Smoking Tobacco Use: Former    Smokeless Tobacco Use: Never    Passive Exposure: Not on Actuary Strain: Not on file  Food Insecurity: Not on File (10/19/2022)   Received from [facility]    Food: 0  Transportation Needs: Not on file  Physical Activity: Not on file  Stress: Not on file  Social Connections: Not on File (10/02/2022)   Received from [employer]   Social Connections    Connectedness: 0  Intimate Partner Violence: Not At Risk (08/14/2023)   Received from [facility]   HITS    Over the last 12 months how often did your partner physically hurt you?: Never    Over the last 12 months how often did your partner insult you or talk down to you?: Never    Over the last 12 months how often did your partner threaten you with physical harm?: Never    Over the last 12 months how often did your partner scream or curse at you?: Never  Depression (PHQ2-9): Medium Risk (01/04/2024)   Depression (PHQ2-9)    PHQ-2 Score: 7  Alcohol Screen: Not on file  Housing: Not on file  Utilities: Not on file  Health Literacy: Not on file    Outpatient Encounter Medications as of 01/04/2024  Medication Sig   aspirin  81 MG EC tablet Take 1 tablet (81 mg total) by mouth daily. Swallow whole.   atorvastatin  (LIPITOR) 10 MG tablet Take 1 tablet (10 mg total) by mouth daily.   Continuous Blood Gluc Sensor (FREESTYLE LIBRE 3 SENSOR) MISC 1 Device by Does not apply route every 14 (fourteen) days. Place 1 sensor on the skin every 14 days. Use to check glucose continuously   Dulaglutide  (TRULICITY ) 4.5 MG/0.5ML SOAJ Inject 4.5 mg into the skin once a week.   empagliflozin  (JARDIANCE ) 10 MG TABS tablet TAKE ONE TABLET DAILY BEFORE BREAKFAST   enalapril  (VASOTEC ) 2.5 MG tablet Take 1 tablet (2.5 mg total) by mouth daily.   glucose blood (ACCU-CHEK GUIDE) test strip Check BS up to 4 times daily Dx E11.65   HYDROcodone -acetaminophen  (NORCO/VICODIN)  5-325 MG tablet One tablet every four hours as needed for acute pain.  Limit of five days per [place] statue.   HYDROcodone -acetaminophen  (NORCO/VICODIN) 5-325 MG tablet Take 1 tablet by mouth every 12 (twelve) hours as needed for moderate pain (pain score 4-6).   hydrOXYzine (ATARAX) 10 MG tablet Take 1 tablet (10 mg total) by mouth 3 (three) times daily as needed for anxiety.   Insulin  Pen Needle 32G X 4 MM MISC Use with insulin  daily Dx E11.69   methocarbamol  (ROBAXIN ) 500 MG tablet Take 1 tablet (500 mg total) by mouth every 8 (eight) hours as needed for muscle spasms.   No facility-administered encounter medications on file as of 01/04/2024.    Allergies[1]  Pertinent ROS per HPI, otherwise unremarkable      Objective:  BP 131/84   Pulse 82   Temp (!) 96.4 F (35.8 C)   Ht 5' 8 (1.727 m)   Wt 180 lb 3.2 oz (81.7 kg)   SpO2 92%   BMI 27.40 kg/m    Wt Readings from Last 3 Encounters:  01/04/24 180 lb 3.2 oz (81.7 kg)  11/21/23 186 lb 9.6 oz (84.6 kg)  08/21/23 192 lb (87.1 kg)    Physical Exam Vitals and nursing note reviewed.  Constitutional:      General: She is not in acute distress.    Appearance: Normal appearance. She is not ill-appearing, toxic-appearing or diaphoretic.  HENT:     Head: Normocephalic and atraumatic.     Mouth/Throat:     Mouth: Mucous membranes are moist.  Eyes:     Pupils: Pupils are equal, round, and reactive to light.  Cardiovascular:     Rate and Rhythm: Normal rate and regular rhythm.     Heart sounds: Normal heart sounds.  Pulmonary:     Effort: Pulmonary effort is normal.     Breath sounds: Normal breath sounds.  Musculoskeletal:     Cervical back: Neck supple.  Skin:    General: Skin is dry.     Capillary Refill: Capillary refill takes less than 2 seconds.  Neurological:     General: No focal deficit present.     Mental Status: She is alert and oriented to person, place, and time.  Psychiatric:        Attention and  Perception: Attention and perception normal.        Mood and Affect: Affect is tearful.        Speech: Speech normal.        Behavior: Behavior normal. Behavior is cooperative.        Thought Content: Thought content normal.        Cognition and Memory: Cognition and memory normal.       Results for orders placed or performed in visit on 11/21/23  Bayer DCA Hb A1c Waived   Collection Time: 11/21/23  9:52 AM  Result Value Ref Range   HB A1C (BAYER DCA - WAIVED) 8.2 (H) 4.8 - 5.6 %  CMP14+EGFR   Collection Time: 11/21/23  9:54 AM  Result Value Ref Range   Glucose 222 (H) 70 - 99 mg/dL   BUN 20 8 - 27 mg/dL   Creatinine, Ser 9.12 0.57 - 1.00 mg/dL   eGFR 75 >40 fO/fpw/8.26   BUN/Creatinine Ratio 23 12 - 28   Sodium 138 134 - 144 mmol/L   Potassium 4.6 3.5 - 5.2 mmol/L   Chloride 98 96 - 106 mmol/L   CO2 22 20 - 29 mmol/L   Calcium  9.6 8.7 - 10.3 mg/dL   Total Protein 7.7  6.0 - 8.5 g/dL   Albumin 4.5 3.9 - 4.9 g/dL   Globulin, Total 3.2 1.5 - 4.5 g/dL   Bilirubin Total 0.5 0.0 - 1.2 mg/dL   Alkaline Phosphatase 119 49 - 135 IU/L   AST 24 0 - 40 IU/L   ALT 35 (H) 0 - 32 IU/L  Lipid panel   Collection Time: 11/21/23  9:54 AM  Result Value Ref Range   Cholesterol, Total 217 (H) 100 - 199 mg/dL   Triglycerides 892 0 - 149 mg/dL   HDL 59 >60 mg/dL   VLDL Cholesterol Cal 19 5 - 40 mg/dL   LDL Chol Calc (NIH) 860 (H) 0 - 99 mg/dL   Chol/HDL Ratio 3.7 0.0 - 4.4 ratio  CBC with Differential/Platelet   Collection Time: 11/21/23  9:54 AM  Result Value Ref Range   WBC 11.8 (H) 3.4 - 10.8 x10E3/uL   RBC 5.29 (H) 3.77 - 5.28 x10E6/uL   Hemoglobin 15.4 11.1 - 15.9 g/dL   Hematocrit 52.1 (H) 65.9 - 46.6 %   MCV 90 79 - 97 fL   MCH 29.1 26.6 - 33.0 pg   MCHC 32.2 31.5 - 35.7 g/dL   RDW 87.3 88.2 - 84.5 %   Platelets 328 150 - 450 x10E3/uL   Neutrophils 77 Not Estab. %   Lymphs 15 Not Estab. %   Monocytes 6 Not Estab. %   Eos 1 Not Estab. %   Basos 0 Not Estab. %   Neutrophils  Absolute 9.1 (H) 1.4 - 7.0 x10E3/uL   Lymphocytes Absolute 1.8 0.7 - 3.1 x10E3/uL   Monocytes Absolute 0.7 0.1 - 0.9 x10E3/uL   EOS (ABSOLUTE) 0.1 0.0 - 0.4 x10E3/uL   Basophils Absolute 0.0 0.0 - 0.2 x10E3/uL   Immature Granulocytes 1 Not Estab. %   Immature Grans (Abs) 0.1 0.0 - 0.1 x10E3/uL  Microalbumin / creatinine urine ratio   Collection Time: 11/21/23 10:40 AM  Result Value Ref Range   Creatinine, Urine 25.0 Not Estab. mg/dL   Microalbumin, Urine <6.9 Not Estab. ug/mL   Microalb/Creat Ratio <12 0 - 29 mg/g creat       Pertinent labs & imaging results that were available during my care of the patient were reviewed by me and considered in my medical decision making.  Assessment & Plan:  Stefana was seen today for depression.  Diagnoses and all orders for this visit:  Situational mixed anxiety and depressive disorder -     hydrOXYzine (ATARAX) 10 MG tablet; Take 1 tablet (10 mg total) by mouth 3 (three) times daily as needed for anxiety.  Grief reaction -     hydrOXYzine (ATARAX) 10 MG tablet; Take 1 tablet (10 mg total) by mouth 3 (three) times daily as needed for anxiety.     Adjustment disorder with mixed anxiety and depressed mood Situational anxiety and depression following the recent loss of her father. Symptoms include insomnia, social withdrawal, and crying. Previously managed with Klonopin, which was beneficial. Current plan involves as-needed medication to manage acute symptoms. - Prescribed hydroxyzine as needed, up to three times a day for anxiety. - Advised to monitor for drowsiness as a side effect of hydroxyzine. - Scheduled follow-up appointment in two weeks to assess response to treatment. - Instructed to return sooner if symptoms worsen.          Continue all other maintenance medications.  Follow up plan: Return in 2 weeks (on 01/18/2024), or if symptoms worsen or fail to improve, for Anxiety,  Depression.   Continue healthy lifestyle choices,  including diet (rich in fruits, vegetables, and lean proteins, and low in salt and simple carbohydrates) and exercise (at least 30 minutes of moderate physical activity daily).  Educational handout given for depression, managing loss  The above assessment and management plan was discussed with the patient. The patient verbalized understanding of and has agreed to the management plan. Patient is aware to call the clinic if they develop any new symptoms or if symptoms persist or worsen. Patient is aware when to return to the clinic for a follow-up visit. Patient educated on when it is appropriate to go to the emergency department.   Rosaline Bruns, FNP-C [facility] [phone]     [1]  Allergies Allergen Reactions   Tramadol Nausea And Vomiting and Dermatitis   Metformin  Other (See Comments)    Headaches   Metformin  And Related Other (See Comments)    Headaches

## 2024-01-07 ENCOUNTER — Ambulatory Visit: Admitting: Physical Therapy

## 2024-01-14 ENCOUNTER — Ambulatory Visit: Admitting: Physical Therapy

## 2024-01-14 ENCOUNTER — Other Ambulatory Visit: Payer: Self-pay

## 2024-01-14 DIAGNOSIS — M25612 Stiffness of left shoulder, not elsewhere classified: Secondary | ICD-10-CM | POA: Diagnosis present

## 2024-01-14 DIAGNOSIS — R293 Abnormal posture: Secondary | ICD-10-CM | POA: Diagnosis present

## 2024-01-14 DIAGNOSIS — M7502 Adhesive capsulitis of left shoulder: Secondary | ICD-10-CM | POA: Insufficient documentation

## 2024-01-14 DIAGNOSIS — G8929 Other chronic pain: Secondary | ICD-10-CM | POA: Diagnosis present

## 2024-01-14 DIAGNOSIS — M25512 Pain in left shoulder: Secondary | ICD-10-CM | POA: Diagnosis present

## 2024-01-14 DIAGNOSIS — M6281 Muscle weakness (generalized): Secondary | ICD-10-CM | POA: Diagnosis present

## 2024-01-14 DIAGNOSIS — M542 Cervicalgia: Secondary | ICD-10-CM | POA: Diagnosis present
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
## 2024-01-18 ENCOUNTER — Ambulatory Visit: Admitting: Family Medicine

## 2024-01-18 ENCOUNTER — Encounter: Payer: Self-pay | Admitting: Family Medicine

## 2024-01-18 VITALS — BP 128/81 | HR 84 | Temp 96.2°F | Ht 68.0 in | Wt 185.6 lb

## 2024-01-18 DIAGNOSIS — F4323 Adjustment disorder with mixed anxiety and depressed mood: Secondary | ICD-10-CM

## 2024-01-18 NOTE — Progress Notes (Signed)
 "    Subjective:  Patient ID: Catherine Chapman, female    DOB: 09/10/1960, 63 y.o.   MRN: [id number]  Patient Care Team: Severa Rock HERO, FNP as PCP - General (Family Medicine) Ladora Ross Lacy Phebe, MD as Referring Physician (Optometry)   Chief Complaint:  Anxiety and Depression (2 week follow up - patient states she is doing a lot better )   HPI: Catherine Chapman is a 63 y.o. female presenting on 01/18/2024 for Anxiety and Depression (2 week follow up - patient states she is doing a lot better )   Catherine Chapman is a 63 year old female who presents for follow-up regarding her use of hydroxyzine  for anxiety management.  She feels better overall and has not needed to take hydroxyzine  daily. She used it up until her father's funeral and shortly after, but now only takes it when she feels she is 'having a moment'. The medication is effective in calming her down when needed.  Her father recently passed away, and she cut her hair in his honor, which was noted by family members to resemble his style.           01/18/2024    9:16 AM 01/04/2024    9:52 AM 11/21/2023    9:49 AM 10/10/2022    3:26 PM  GAD 7 : Generalized Anxiety Score  Nervous, Anxious, on Edge 0 1 0 0  Control/stop worrying 0 1 0 0  Worry too much - different things 0 1 0 0  Trouble relaxing 0 1 0 0  Restless 0 1 0 0  Easily annoyed or irritable 0 1 0 0  Afraid - awful might happen 0 0 0 0  Total GAD 7 Score 0 6 0 0  Anxiety Difficulty Not difficult at all Somewhat difficult Not difficult at all Not difficult at all       01/18/2024    9:16 AM 01/04/2024    9:52 AM 11/21/2023    9:49 AM 10/10/2022    3:35 PM 10/10/2022    3:26 PM  Depression screen PHQ 2/9  Decreased Interest 0 1 0 0 0  Down, Depressed, Hopeless 0 1 0  0  PHQ - 2 Score 0 2 0 0 0  Altered sleeping 0 1 0  0  Tired, decreased energy 0 1 0  0  Change in appetite 0 1 0  0  Feeling bad or failure about yourself  0 1 0  0  Trouble concentrating 0 1 0  0   Moving slowly or fidgety/restless 0 0 0  0  Suicidal thoughts 0 0 0  0  PHQ-9 Score 0 7 0   0   Difficult doing work/chores Not difficult at all Somewhat difficult Not difficult at all  Not difficult at all     Data saved with a previous flowsheet row definition      Relevant past medical, surgical, family, and social history reviewed and updated as indicated.  Allergies and medications reviewed and updated. Data reviewed: Chart in Epic.   Past Medical History:  Diagnosis Date   Aortic atherosclerosis    Back pain    Diabetes (HCC)    Mediastinal lymphadenopathy 03/06/2021   Chest CT at [facility]   Pulmonary nodule 03/06/2021   Chest CT at [facility]    Past Surgical History:  Procedure Laterality Date   I & D EXTREMITY Left 01/17/2017   Procedure: IRRIGATION AND DEBRIBEMENT WITH OPEN REDUCTION INTERNAL  FIXATION LEFT INDEX FINGER WITH EXTENSOR TENDON REPAIR;  Surgeon: Sissy Cough, MD;  Location: [facility] OR;  Service: Orthopedics;  Laterality: Left;   KNEE SURGERY     KNEE SURGERY     ROTATOR CUFF REPAIR      Social History   Socioeconomic History   Marital status: Single    Spouse name: Not on file   Number of children: Not on file   Years of education: Not on file   Highest education level: Not on file  Occupational History   Not on file  Tobacco Use   Smoking status: Former    Current packs/day: 0.00    Types: Cigarettes    Quit date: 27    Years since quitting: 36.0   Smokeless tobacco: Never  Vaping Use   Vaping status: Never Used  Substance and Sexual Activity   Alcohol use: Yes    Comment: occ   Drug use: No   Sexual activity: Yes    Birth control/protection: None  Other Topics Concern   Not on file  Social History Narrative   Not on file   Social Drivers of Health   Tobacco Use: Medium Risk (01/18/2024)   Patient History    Smoking Tobacco Use: Former    Smokeless Tobacco Use: Never    Passive Exposure: Not on [employer] Strain: Not on file  Food Insecurity: Not on File (10/19/2022)   Received from [employer] Insecurity    Food: 0  Transportation Needs: Not on file  Physical Activity: Not on file  Stress: Not on file  Social Connections: Not on File (10/02/2022)   Received from [employer]   Social Connections    Connectedness: 0  Intimate Partner Violence: Not At Risk (08/14/2023)   Received from [facility]   HITS    Over the last 12 months how often did your partner physically hurt you?: Never    Over the last 12 months how often did your partner insult you or talk down to you?: Never    Over the last 12 months how often did your partner threaten you with physical harm?: Never    Over the last 12 months how often did your partner scream or curse at you?: Never  Depression (PHQ2-9): Low Risk (01/18/2024)   Depression (PHQ2-9)    PHQ-2 Score: 0  Recent Concern: Depression (PHQ2-9) - Medium Risk (01/04/2024)   Depression (PHQ2-9)    PHQ-2 Score: 7  Alcohol Screen: Not on file  Housing: Not on file  Utilities: Not on file  Health Literacy: Not on file    Outpatient Encounter Medications as of 01/18/2024  Medication Sig   aspirin  81 MG EC tablet Take 1 tablet (81 mg total) by mouth daily. Swallow whole.   atorvastatin  (LIPITOR) 10 MG tablet Take 1 tablet (10 mg total) by mouth daily.   Continuous Blood Gluc Sensor (FREESTYLE LIBRE 3 SENSOR) MISC 1 Device by Does not apply route every 14 (fourteen) days. Place 1 sensor on the skin every 14 days. Use to check glucose continuously   Dulaglutide  (TRULICITY ) 4.5 MG/0.5ML SOAJ Inject 4.5 mg into the skin once a week.   empagliflozin  (JARDIANCE ) 10 MG TABS tablet TAKE ONE TABLET DAILY BEFORE BREAKFAST   enalapril  (VASOTEC ) 2.5 MG tablet Take 1 tablet (2.5 mg total) by mouth daily.   glucose blood (ACCU-CHEK GUIDE) test strip Check BS up to 4 times daily Dx E11.65   HYDROcodone -acetaminophen  (NORCO/VICODIN) 5-325 MG tablet One  tablet every four hours  as needed for acute pain.  Limit of five days per [place] statue.   HYDROcodone -acetaminophen  (NORCO/VICODIN) 5-325 MG tablet Take 1 tablet by mouth every 12 (twelve) hours as needed for moderate pain (pain score 4-6).   hydrOXYzine  (ATARAX ) 10 MG tablet Take 1 tablet (10 mg total) by mouth 3 (three) times daily as needed for anxiety.   Insulin  Pen Needle 32G X 4 MM MISC Use with insulin  daily Dx E11.69   methocarbamol  (ROBAXIN ) 500 MG tablet Take 1 tablet (500 mg total) by mouth every 8 (eight) hours as needed for muscle spasms.   No facility-administered encounter medications on file as of 01/18/2024.    Allergies[1]  Pertinent ROS per HPI, otherwise unremarkable      Objective:  BP 128/81   Pulse 84   Temp (!) 96.2 F (35.7 C)   Ht 5' 8 (1.727 m)   Wt 185 lb 9.6 oz (84.2 kg)   SpO2 97%   BMI 28.22 kg/m    Wt Readings from Last 3 Encounters:  01/18/24 185 lb 9.6 oz (84.2 kg)  01/04/24 180 lb 3.2 oz (81.7 kg)  11/21/23 186 lb 9.6 oz (84.6 kg)    Physical Exam Vitals and nursing note reviewed.  Constitutional:      General: She is not in acute distress.    Appearance: Normal appearance. She is not ill-appearing, toxic-appearing or diaphoretic.  HENT:     Head: Normocephalic and atraumatic.     Nose: Nose normal.     Mouth/Throat:     Mouth: Mucous membranes are moist.  Eyes:     Conjunctiva/sclera: Conjunctivae normal.     Pupils: Pupils are equal, round, and reactive to light.  Cardiovascular:     Rate and Rhythm: Normal rate.  Pulmonary:     Effort: Pulmonary effort is normal.  Musculoskeletal:     Cervical back: Neck supple.  Skin:    General: Skin is warm and dry.     Capillary Refill: Capillary refill takes less than 2 seconds.  Neurological:     General: No focal deficit present.     Mental Status: She is alert and oriented to person, place, and time.  Psychiatric:        Mood and Affect: Mood normal.        Behavior: Behavior normal.        Thought  Content: Thought content normal.        Judgment: Judgment normal.       Results for orders placed or performed in visit on 11/21/23  Bayer DCA Hb A1c Waived   Collection Time: 11/21/23  9:52 AM  Result Value Ref Range   HB A1C (BAYER DCA - WAIVED) 8.2 (H) 4.8 - 5.6 %  CMP14+EGFR   Collection Time: 11/21/23  9:54 AM  Result Value Ref Range   Glucose 222 (H) 70 - 99 mg/dL   BUN 20 8 - 27 mg/dL   Creatinine, Ser 9.12 0.57 - 1.00 mg/dL   eGFR 75 >40 fO/fpw/8.26   BUN/Creatinine Ratio 23 12 - 28   Sodium 138 134 - 144 mmol/L   Potassium 4.6 3.5 - 5.2 mmol/L   Chloride 98 96 - 106 mmol/L   CO2 22 20 - 29 mmol/L   Calcium  9.6 8.7 - 10.3 mg/dL   Total Protein 7.7 6.0 - 8.5 g/dL   Albumin 4.5 3.9 - 4.9 g/dL   Globulin, Total 3.2 1.5 - 4.5 g/dL   Bilirubin  Total 0.5 0.0 - 1.2 mg/dL   Alkaline Phosphatase 119 49 - 135 IU/L   AST 24 0 - 40 IU/L   ALT 35 (H) 0 - 32 IU/L  Lipid panel   Collection Time: 11/21/23  9:54 AM  Result Value Ref Range   Cholesterol, Total 217 (H) 100 - 199 mg/dL   Triglycerides 892 0 - 149 mg/dL   HDL 59 >60 mg/dL   VLDL Cholesterol Cal 19 5 - 40 mg/dL   LDL Chol Calc (NIH) 860 (H) 0 - 99 mg/dL   Chol/HDL Ratio 3.7 0.0 - 4.4 ratio  CBC with Differential/Platelet   Collection Time: 11/21/23  9:54 AM  Result Value Ref Range   WBC 11.8 (H) 3.4 - 10.8 x10E3/uL   RBC 5.29 (H) 3.77 - 5.28 x10E6/uL   Hemoglobin 15.4 11.1 - 15.9 g/dL   Hematocrit 52.1 (H) 65.9 - 46.6 %   MCV 90 79 - 97 fL   MCH 29.1 26.6 - 33.0 pg   MCHC 32.2 31.5 - 35.7 g/dL   RDW 87.3 88.2 - 84.5 %   Platelets 328 150 - 450 x10E3/uL   Neutrophils 77 Not Estab. %   Lymphs 15 Not Estab. %   Monocytes 6 Not Estab. %   Eos 1 Not Estab. %   Basos 0 Not Estab. %   Neutrophils Absolute 9.1 (H) 1.4 - 7.0 x10E3/uL   Lymphocytes Absolute 1.8 0.7 - 3.1 x10E3/uL   Monocytes Absolute 0.7 0.1 - 0.9 x10E3/uL   EOS (ABSOLUTE) 0.1 0.0 - 0.4 x10E3/uL   Basophils Absolute 0.0 0.0 - 0.2 x10E3/uL    Immature Granulocytes 1 Not Estab. %   Immature Grans (Abs) 0.1 0.0 - 0.1 x10E3/uL  Microalbumin / creatinine urine ratio   Collection Time: 11/21/23 10:40 AM  Result Value Ref Range   Creatinine, Urine 25.0 Not Estab. mg/dL   Microalbumin, Urine <6.9 Not Estab. ug/mL   Microalb/Creat Ratio <12 0 - 29 mg/g creat       Pertinent labs & imaging results that were available during my care of the patient were reviewed by me and considered in my medical decision making.  Assessment & Plan:  Maely was seen today for anxiety and depression.  Diagnoses and all orders for this visit:  Situational mixed anxiety and depressive disorder      Adjustment disorder with mixed anxiety and depressed mood Reports improvement in symptoms, using hydroxyzine  as needed for anxiety, particularly during stressful events such as a recent funeral. Hydroxyzine  effectively calms her, and she is not taking it daily. Has three refills remaining. - Continue hydroxyzine  as needed for anxiety.          Continue all other maintenance medications.  Follow up plan: Return if symptoms worsen or fail to improve.   Continue healthy lifestyle choices, including diet (rich in fruits, vegetables, and lean proteins, and low in salt and simple carbohydrates) and exercise (at least 30 minutes of moderate physical activity daily).   The above assessment and management plan was discussed with the patient. The patient verbalized understanding of and has agreed to the management plan. Patient is aware to call the clinic if they develop any new symptoms or if symptoms persist or worsen. Patient is aware when to return to the clinic for a follow-up visit. Patient educated on when it is appropriate to go to the emergency department.   Rosaline Bruns, FNP-C [facility] [phone]     [1]  Allergies Allergen Reactions  Tramadol Nausea And Vomiting and Dermatitis   Metformin  Other (See Comments)     Headaches   Metformin  And Related Other (See Comments)    Headaches   "

## 2024-01-22 ENCOUNTER — Ambulatory Visit: Admitting: Physical Therapy

## 2024-01-22 ENCOUNTER — Encounter: Payer: Self-pay | Admitting: Physical Therapy

## 2024-01-22 DIAGNOSIS — G8929 Other chronic pain: Secondary | ICD-10-CM

## 2024-01-22 DIAGNOSIS — R293 Abnormal posture: Secondary | ICD-10-CM

## 2024-01-22 DIAGNOSIS — M25612 Stiffness of left shoulder, not elsewhere classified: Secondary | ICD-10-CM

## 2024-01-22 DIAGNOSIS — M542 Cervicalgia: Secondary | ICD-10-CM

## 2024-01-22 DIAGNOSIS — M6281 Muscle weakness (generalized): Secondary | ICD-10-CM

## 2024-01-22 DIAGNOSIS — M25511 Pain in right shoulder: Secondary | ICD-10-CM
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
## 2024-01-29 ENCOUNTER — Ambulatory Visit: Attending: Family Medicine | Admitting: *Deleted

## 2024-01-29 ENCOUNTER — Encounter: Payer: Self-pay | Admitting: *Deleted

## 2024-01-29 DIAGNOSIS — M542 Cervicalgia: Secondary | ICD-10-CM | POA: Diagnosis present

## 2024-01-29 DIAGNOSIS — M6281 Muscle weakness (generalized): Secondary | ICD-10-CM | POA: Diagnosis present

## 2024-01-29 DIAGNOSIS — R293 Abnormal posture: Secondary | ICD-10-CM | POA: Insufficient documentation

## 2024-01-29 DIAGNOSIS — M25512 Pain in left shoulder: Secondary | ICD-10-CM | POA: Diagnosis present

## 2024-01-29 DIAGNOSIS — G8929 Other chronic pain: Secondary | ICD-10-CM | POA: Diagnosis present

## 2024-01-29 DIAGNOSIS — M25612 Stiffness of left shoulder, not elsewhere classified: Secondary | ICD-10-CM | POA: Diagnosis present

## 2024-01-29 DIAGNOSIS — M25511 Pain in right shoulder: Secondary | ICD-10-CM | POA: Diagnosis present
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
## 2024-01-30 ENCOUNTER — Other Ambulatory Visit: Payer: Self-pay | Admitting: Family Medicine

## 2024-02-05 ENCOUNTER — Encounter: Payer: Self-pay | Admitting: Physical Therapy

## 2024-02-05 ENCOUNTER — Ambulatory Visit: Admitting: Physical Therapy

## 2024-02-05 DIAGNOSIS — M25612 Stiffness of left shoulder, not elsewhere classified: Secondary | ICD-10-CM | POA: Diagnosis not present

## 2024-02-05 DIAGNOSIS — M6281 Muscle weakness (generalized): Secondary | ICD-10-CM

## 2024-02-05 DIAGNOSIS — M542 Cervicalgia: Secondary | ICD-10-CM

## 2024-02-05 DIAGNOSIS — R293 Abnormal posture: Secondary | ICD-10-CM

## 2024-02-05 DIAGNOSIS — M25511 Pain in right shoulder: Secondary | ICD-10-CM

## 2024-02-05 DIAGNOSIS — G8929 Other chronic pain: Secondary | ICD-10-CM
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
## 2024-02-06 ENCOUNTER — Other Ambulatory Visit (INDEPENDENT_AMBULATORY_CARE_PROVIDER_SITE_OTHER)

## 2024-02-06 ENCOUNTER — Encounter: Payer: Self-pay | Admitting: Orthopedic Surgery

## 2024-02-06 ENCOUNTER — Ambulatory Visit (INDEPENDENT_AMBULATORY_CARE_PROVIDER_SITE_OTHER): Admitting: Orthopedic Surgery

## 2024-02-06 DIAGNOSIS — M25511 Pain in right shoulder: Secondary | ICD-10-CM

## 2024-02-06 DIAGNOSIS — M542 Cervicalgia: Secondary | ICD-10-CM

## 2024-02-06 MED ORDER — IBUPROFEN 800 MG PO TABS: ORAL_TABLET | ORAL | 1 refills | Status: AC
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
## 2024-02-11 ENCOUNTER — Ambulatory Visit

## 2024-02-11 DIAGNOSIS — R293 Abnormal posture: Secondary | ICD-10-CM

## 2024-02-11 DIAGNOSIS — G8929 Other chronic pain: Secondary | ICD-10-CM

## 2024-02-11 DIAGNOSIS — M542 Cervicalgia: Secondary | ICD-10-CM

## 2024-02-11 DIAGNOSIS — M25511 Pain in right shoulder: Secondary | ICD-10-CM

## 2024-02-11 DIAGNOSIS — M25612 Stiffness of left shoulder, not elsewhere classified: Secondary | ICD-10-CM

## 2024-02-11 DIAGNOSIS — M6281 Muscle weakness (generalized): Secondary | ICD-10-CM
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
## 2024-02-14 ENCOUNTER — Encounter: Payer: Self-pay | Admitting: Physical Therapy

## 2024-02-14 ENCOUNTER — Ambulatory Visit: Admitting: Physical Therapy

## 2024-02-14 DIAGNOSIS — R293 Abnormal posture: Secondary | ICD-10-CM

## 2024-02-14 DIAGNOSIS — M6281 Muscle weakness (generalized): Secondary | ICD-10-CM

## 2024-02-14 DIAGNOSIS — G8929 Other chronic pain: Secondary | ICD-10-CM

## 2024-02-14 DIAGNOSIS — M25511 Pain in right shoulder: Secondary | ICD-10-CM

## 2024-02-14 DIAGNOSIS — M25612 Stiffness of left shoulder, not elsewhere classified: Secondary | ICD-10-CM

## 2024-02-14 DIAGNOSIS — M542 Cervicalgia: Secondary | ICD-10-CM
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
## 2024-02-21 ENCOUNTER — Ambulatory Visit: Admitting: Family Medicine

## 2024-03-19 ENCOUNTER — Ambulatory Visit: Admitting: Surgical
